# Patient Record
Sex: Female | Born: 1980 | Race: Black or African American | Hispanic: No | Marital: Single | Smoking: Never smoker
Health system: Southern US, Community
[De-identification: ages and names within clinical notes are randomized; demographics above are authoritative.]

## PROBLEM LIST (undated history)

## (undated) DIAGNOSIS — N879 Dysplasia of cervix uteri, unspecified: Secondary | ICD-10-CM

## (undated) DIAGNOSIS — N809 Endometriosis, unspecified: Secondary | ICD-10-CM

## (undated) DIAGNOSIS — N946 Dysmenorrhea, unspecified: Secondary | ICD-10-CM

## (undated) DIAGNOSIS — Z8742 Personal history of other diseases of the female genital tract: Secondary | ICD-10-CM

## (undated) DIAGNOSIS — E041 Nontoxic single thyroid nodule: Secondary | ICD-10-CM

## (undated) DIAGNOSIS — K219 Gastro-esophageal reflux disease without esophagitis: Secondary | ICD-10-CM

## (undated) DIAGNOSIS — L0390 Cellulitis, unspecified: Secondary | ICD-10-CM

## (undated) DIAGNOSIS — E010 Iodine-deficiency related diffuse (endemic) goiter: Secondary | ICD-10-CM

## (undated) DIAGNOSIS — E119 Type 2 diabetes mellitus without complications: Secondary | ICD-10-CM

## (undated) DIAGNOSIS — K449 Diaphragmatic hernia without obstruction or gangrene: Secondary | ICD-10-CM

## (undated) DIAGNOSIS — R809 Proteinuria, unspecified: Secondary | ICD-10-CM

## (undated) DIAGNOSIS — D134 Benign neoplasm of liver: Secondary | ICD-10-CM

## (undated) HISTORY — PX: LEEP: SHX91

## (undated) HISTORY — DX: Iodine-deficiency related diffuse (endemic) goiter: E01.0

## (undated) HISTORY — DX: Proteinuria, unspecified: R80.9

## (undated) HISTORY — DX: Personal history of other diseases of the female genital tract: Z87.42

## (undated) HISTORY — PX: LAPAROSCOPY: SHX197

## (undated) HISTORY — PX: COLPOSCOPY: SHX161

## (undated) HISTORY — DX: Diaphragmatic hernia without obstruction or gangrene: K44.9

## (undated) HISTORY — DX: Gastro-esophageal reflux disease without esophagitis: K21.9

## (undated) HISTORY — DX: Nontoxic single thyroid nodule: E04.1

## (undated) HISTORY — PX: REDUCTION MAMMAPLASTY: SUR839

## (undated) HISTORY — DX: Dysmenorrhea, unspecified: N94.6

## (undated) HISTORY — DX: Endometriosis, unspecified: N80.9

## (undated) HISTORY — DX: Benign neoplasm of liver: D13.4

## (undated) HISTORY — PX: OTHER SURGICAL HISTORY: SHX169

## (undated) HISTORY — PX: ESOPHAGOGASTRODUODENOSCOPY ENDOSCOPY: SHX5814

## (undated) HISTORY — DX: Type 2 diabetes mellitus without complications: E11.9

## (undated) HISTORY — DX: Dysplasia of cervix uteri, unspecified: N87.9

## (undated) HISTORY — DX: Cellulitis, unspecified: L03.90

## (undated) HISTORY — PX: BREAST BIOPSY: SHX20

## (undated) HISTORY — PX: RHINOPLASTY: SUR1284

---

## 2003-12-28 ENCOUNTER — Ambulatory Visit (HOSPITAL_COMMUNITY): Admission: RE | Admit: 2003-12-28 | Discharge: 2003-12-28 | Payer: Self-pay

## 2003-12-28 ENCOUNTER — Ambulatory Visit (HOSPITAL_BASED_OUTPATIENT_CLINIC_OR_DEPARTMENT_OTHER): Admission: RE | Admit: 2003-12-28 | Discharge: 2003-12-28 | Payer: Self-pay

## 2005-03-04 ENCOUNTER — Emergency Department (HOSPITAL_COMMUNITY): Admission: EM | Admit: 2005-03-04 | Discharge: 2005-03-04 | Payer: Self-pay | Admitting: Emergency Medicine

## 2005-03-12 ENCOUNTER — Emergency Department (HOSPITAL_COMMUNITY): Admission: EM | Admit: 2005-03-12 | Discharge: 2005-03-12 | Payer: Self-pay | Admitting: Emergency Medicine

## 2005-03-15 DIAGNOSIS — Z9889 Other specified postprocedural states: Secondary | ICD-10-CM | POA: Insufficient documentation

## 2005-03-15 HISTORY — DX: Other specified postprocedural states: Z98.890

## 2005-03-22 ENCOUNTER — Ambulatory Visit: Payer: Self-pay | Admitting: Unknown Physician Specialty

## 2005-09-06 ENCOUNTER — Ambulatory Visit: Payer: Self-pay

## 2006-03-05 ENCOUNTER — Emergency Department: Payer: Self-pay | Admitting: Emergency Medicine

## 2006-03-08 ENCOUNTER — Emergency Department: Payer: Self-pay | Admitting: General Practice

## 2006-04-27 ENCOUNTER — Emergency Department: Payer: Self-pay

## 2006-04-29 ENCOUNTER — Ambulatory Visit: Payer: Self-pay | Admitting: Emergency Medicine

## 2007-07-28 ENCOUNTER — Ambulatory Visit: Payer: Self-pay | Admitting: Unknown Physician Specialty

## 2007-08-05 ENCOUNTER — Ambulatory Visit: Payer: Self-pay | Admitting: Unknown Physician Specialty

## 2011-04-22 ENCOUNTER — Emergency Department: Payer: Self-pay | Admitting: Emergency Medicine

## 2011-04-22 LAB — CBC
HCT: 42 % (ref 35.0–47.0)
HGB: 13.8 g/dL (ref 12.0–16.0)
MCH: 27.8 pg (ref 26.0–34.0)
MCHC: 32.8 g/dL (ref 32.0–36.0)
MCV: 85 fL (ref 80–100)
RDW: 14.2 % (ref 11.5–14.5)

## 2011-04-22 LAB — TROPONIN I: Troponin-I: <0.02 ng/mL

## 2011-04-22 LAB — BASIC METABOLIC PANEL
Anion Gap: 10 (ref 7–16)
Calcium, Total: 9.2 mg/dL (ref 8.5–10.1)
Chloride: 104 mmol/L (ref 98–107)
Creatinine: 0.82 mg/dL (ref 0.60–1.30)
EGFR (African American): >60
Potassium: 3.7 mmol/L (ref 3.5–5.1)

## 2011-04-27 DIAGNOSIS — K2100 Gastro-esophageal reflux disease with esophagitis, without bleeding: Secondary | ICD-10-CM | POA: Insufficient documentation

## 2011-06-08 ENCOUNTER — Ambulatory Visit: Payer: Self-pay | Admitting: Unknown Physician Specialty

## 2011-06-29 DIAGNOSIS — K449 Diaphragmatic hernia without obstruction or gangrene: Secondary | ICD-10-CM | POA: Insufficient documentation

## 2011-06-29 DIAGNOSIS — E282 Polycystic ovarian syndrome: Secondary | ICD-10-CM | POA: Insufficient documentation

## 2011-12-28 DIAGNOSIS — E1121 Type 2 diabetes mellitus with diabetic nephropathy: Secondary | ICD-10-CM | POA: Insufficient documentation

## 2012-06-05 DIAGNOSIS — F32A Depression, unspecified: Secondary | ICD-10-CM | POA: Insufficient documentation

## 2013-05-29 ENCOUNTER — Ambulatory Visit: Payer: Self-pay

## 2014-02-23 DIAGNOSIS — N809 Endometriosis, unspecified: Secondary | ICD-10-CM | POA: Insufficient documentation

## 2014-05-03 ENCOUNTER — Ambulatory Visit: Admit: 2014-05-03 | Disposition: A | Discharge status: Home / Self Care | Payer: Self-pay

## 2014-05-19 ENCOUNTER — Other Ambulatory Visit: Payer: Self-pay

## 2014-05-19 ENCOUNTER — Emergency Department
Admission: EM | Admit: 2014-05-19 | Discharge: 2014-05-19 | Disposition: A | Discharge status: Home / Self Care | Payer: BLUE CROSS/BLUE SHIELD | Attending: Emergency Medicine | Admitting: Emergency Medicine

## 2014-05-19 DIAGNOSIS — R799 Abnormal finding of blood chemistry, unspecified: Secondary | ICD-10-CM | POA: Insufficient documentation

## 2014-05-19 DIAGNOSIS — R899 Unspecified abnormal finding in specimens from other organs, systems and tissues: Secondary | ICD-10-CM

## 2014-05-19 LAB — CBC
HEMATOCRIT: 41.2 % (ref 35.0–47.0)
Hemoglobin: 13.5 g/dL (ref 12.0–16.0)
MCH: 27.1 pg (ref 26.0–34.0)
MCHC: 32.8 g/dL (ref 32.0–36.0)
MCV: 82.6 fL (ref 80.0–100.0)
Platelets: 253 10*3/uL (ref 150–440)
RBC: 4.99 MIL/uL (ref 3.80–5.20)
RDW: 13.9 % (ref 11.5–14.5)
WBC: 9.3 10*3/uL (ref 3.6–11.0)

## 2014-05-19 LAB — BASIC METABOLIC PANEL
Anion gap: 7 (ref 5–15)
BUN: 11 mg/dL (ref 6–20)
CALCIUM: 9.3 mg/dL (ref 8.9–10.3)
CO2: 25 mmol/L (ref 22–32)
Chloride: 104 mmol/L (ref 101–111)
Creatinine, Ser: 0.6 mg/dL (ref 0.44–1.00)
GFR calc Af Amer: >60 mL/min (ref >60)
GFR calc non Af Amer: >60 mL/min (ref >60)
GLUCOSE: 166 mg/dL — AB (ref 65–99)
POTASSIUM: 3.4 mmol/L — AB (ref 3.5–5.1)
SODIUM: 136 mmol/L (ref 135–145)

## 2014-05-19 NOTE — ED Notes (Signed)
Pt ambulatory to room 14 without difficulty or distress noted; pt st sent over by her PCP (Dr Osborne Casco) for elevated K+ of 7.4; pt denies any c/o

## 2014-05-19 NOTE — ED Provider Notes (Signed)
[facility] Emergency Department Provider Note    ____________________________________________  Time seen: 2230  I have reviewed the triage vital signs and the nursing notes.   HISTORY  Chief Complaint No chief complaint on file.   History limited by: Not Limited   HPI Melissa Schroeder is a 34 y.o. female He was sent by her primary care doctor today because of concerns for elevated blood test. Patient states she had a blood draw earlier today as part of a routine checkup and prior to initiating medications. She states that roughly 2 hours after getting home she got a phone call stating her potassium was high and she needed to go to emergency department. She denies any chest pain, shortness breath, fevers. Denies ever having elevated potassium in the past.     No past medical history on file.  There are no active problems to display for this patient.   No past surgical history on file.  No current outpatient prescriptions on file.  Allergies Review of patient's allergies indicates not on file.  No family history on file.  Social History History  Substance Use Topics  . Smoking status: Not on file  . Smokeless tobacco: Not on file  . Alcohol Use: Not on file    Review of Systems  Constitutional: Negative for fever. Cardiovascular: Negative for chest pain. Respiratory: Negative for shortness of breath. Gastrointestinal: Negative for abdominal pain, vomiting and diarrhea. Genitourinary: Negative for dysuria. Musculoskeletal: Negative for back pain. Skin: Negative for rash. Neurological: had a mild headache this morning   10-point ROS otherwise negative.  ____________________________________________   PHYSICAL EXAM:   Constitutional: Alert and oriented. Well appearing and in no distress. Eyes: Conjunctivae are normal. PERRL. Normal extraocular movements. ENT   Head: Normocephalic and atraumatic.   Nose: No  congestion/rhinnorhea.   Mouth/Throat: Mucous membranes are moist.   Neck: No stridor. Hematological/Lymphatic/Immunilogical: No cervical lymphadenopathy. Cardiovascular: Normal rate, regular rhythm.  No murmurs, rubs, or gallops. Respiratory: Normal respiratory effort without tachypnea nor retractions. Breath sounds are clear and equal bilaterally. No wheezes/rales/rhonchi. Gastrointestinal: Soft and nontender. No distention.  Genitourinary: Deferred Musculoskeletal: Normal range of motion in all extremities. No joint effusions.  No lower extremity tenderness nor edema. Neurologic:  Normal speech and language. No gross focal neurologic deficits are appreciated. Speech is normal. No gait instability. Skin:  Skin is warm, dry and intact. No rash noted. Psychiatric: Mood and affect are normal. Speech and behavior are normal. Patient exhibits appropriate insight and judgment.  ____________________________________________    LABS (pertinent positives/negatives)  Labs Reviewed  BASIC METABOLIC PANEL - Abnormal; Notable for the following:    Potassium 3.4 (*)    Glucose, Bld 166 (*)    All other components within normal limits  CBC      ____________________________________________   EKG  EKG Time: 2227 Rate: 80 Rhythm: normal sinus rhythm Axis: normal Intervals: QTC 429 QRS: normal ST changes: no ST elevation, no peak T waves    ____________________________________________    RADIOLOGY  None  ____________________________________________   PROCEDURES  Procedure(s) performed: None  Critical Care performed: No  ____________________________________________   INITIAL IMPRESSION / ASSESSMENT AND PLAN / ED COURSE  Pertinent labs & imaging results that were available during my care of the patient were reviewed by me and considered in my medical decision making (see chart for details).  She was sent here by her primary care doctor because of concerns for  elevated potassium. EKG did not support this diagnosis.  Additionally lab work here does not show patient to be hyperkalemic.  In my judgment, in view of the above findings, the patient has a reassuring evaluation and can be safely discharged.Issues concerning treatment and diagnosis were discussed. There were no barriers to understanding. The plan of treatment explanation was well received by the patient and/or family who then verbalized understanding.  ____________________________________________   FINAL CLINICAL IMPRESSION(S) / ED DIAGNOSES  Final diagnoses:  Abnormal laboratory test result     Nance Pear, MD 05/19/14 2311

## 2014-05-19 NOTE — Discharge Instructions (Signed)
He seek medical attention for any high fevers, chest pain, shortness of breath, or any other new or concerning symptoms.

## 2014-05-19 NOTE — ED Notes (Signed)
   05/19/14 2215  Skin Color/Condition  Skin Color/Condition (WDL) WDL  Pt arrived to ED with reports of abnormal lab results. Pt has no c/o pain or discomfort. Pt reports blood drawn today at PMD office, staff called with reported  Potassium results of 7.4. Pt was told to report to ED.

## 2014-06-16 HISTORY — PX: BREAST SURGERY: SHX581

## 2015-05-20 IMAGING — US THYROID ULTRASOUND
1 series · 13 of 25 positions shown · non-contrast
Comparison: Thyroid ultrasound- 05/29/2013

CLINICAL DATA: History of thyroid nodule. History of prior biopsy
of left-sided thyroid nodule (7307).

EXAM:
THYROID ULTRASOUND
TECHNIQUE: Ultrasound examination of the thyroid gland and adjacent soft
tissues was performed.

[Series 1: thyroid ultrasound · 0.09mm/px · 13 of 102 slices shown]
[im 1/102]
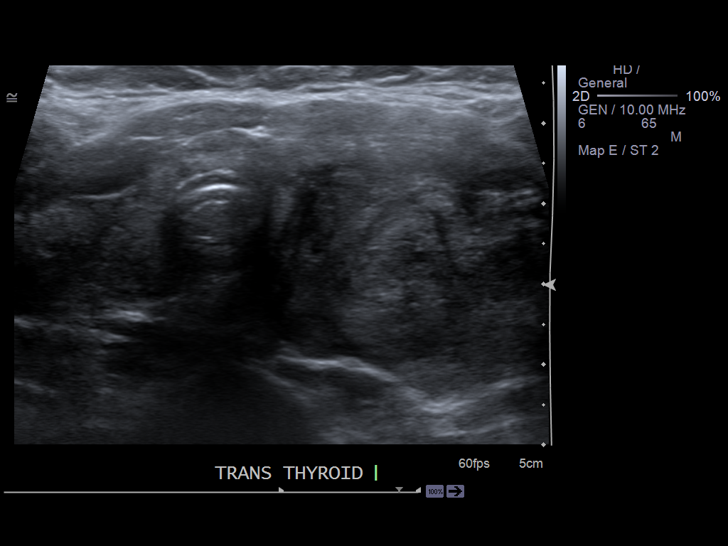
[im 9/102]
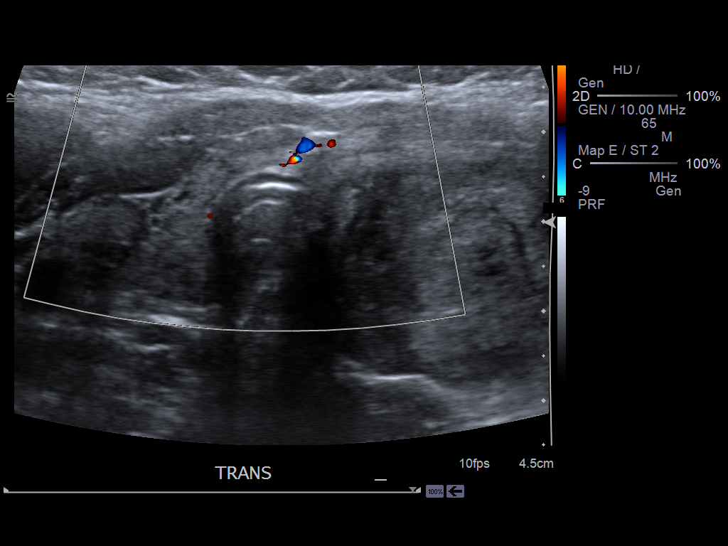
[im 17/102]
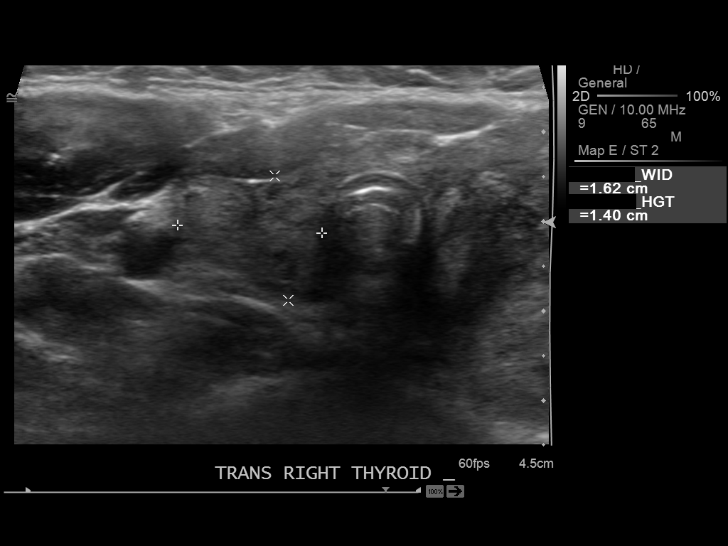
[im 26/102]
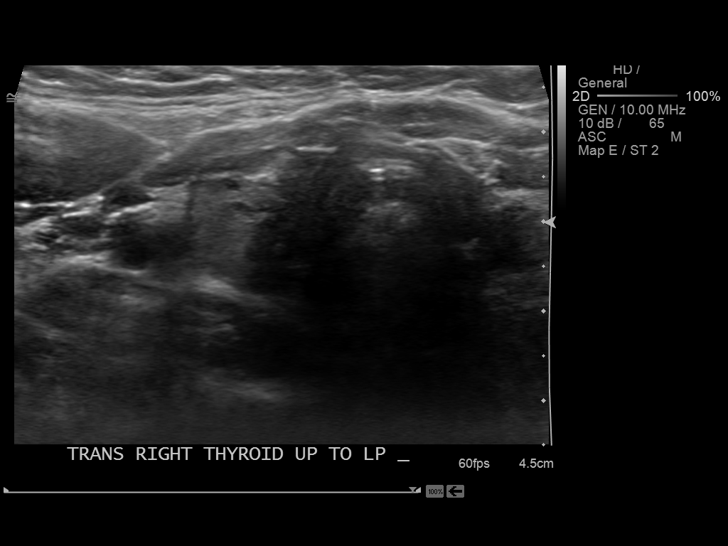
[im 34/102]
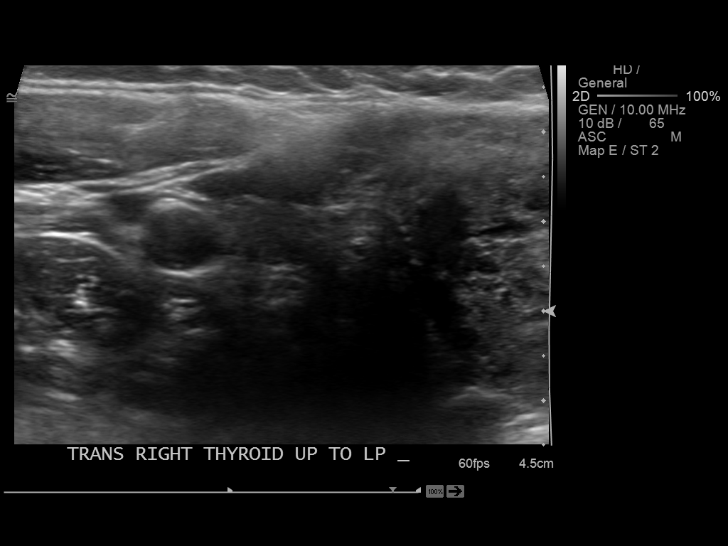
[im 43/102]
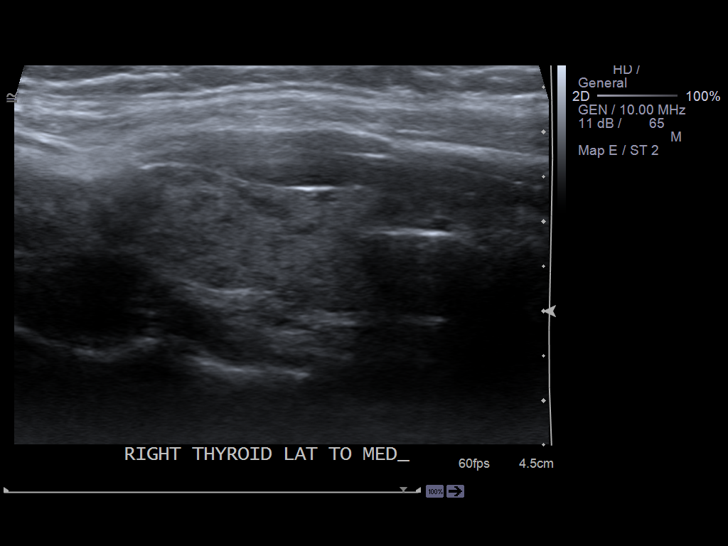
[im 51/102]
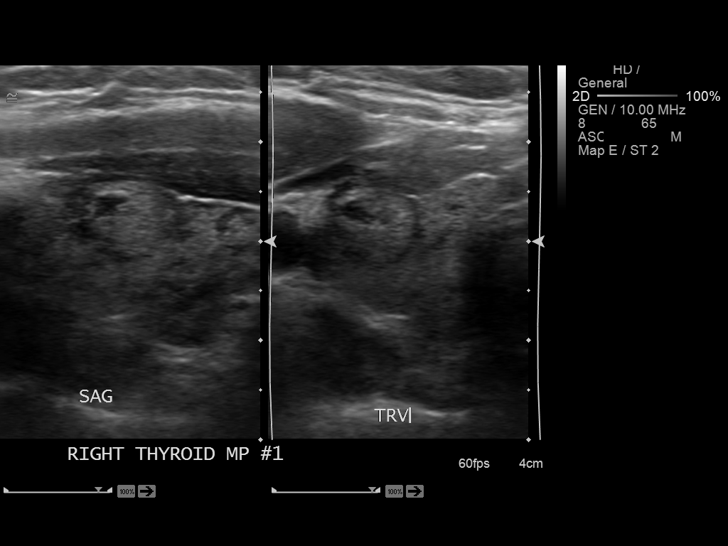
[im 59/102]
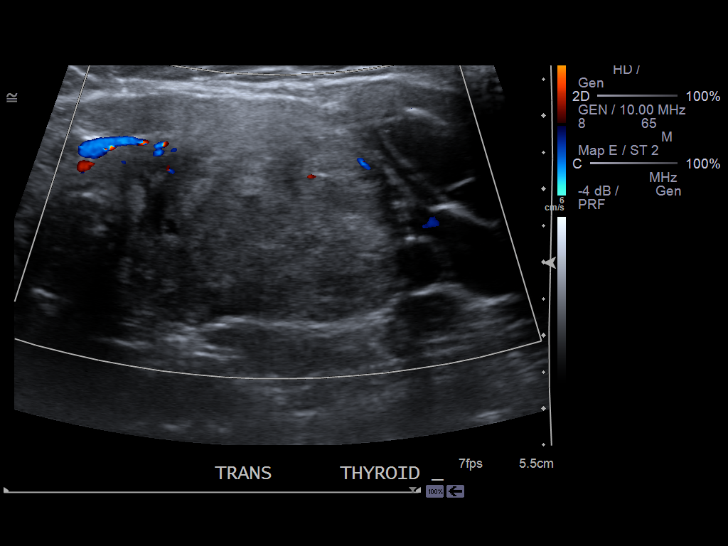
[im 68/102]
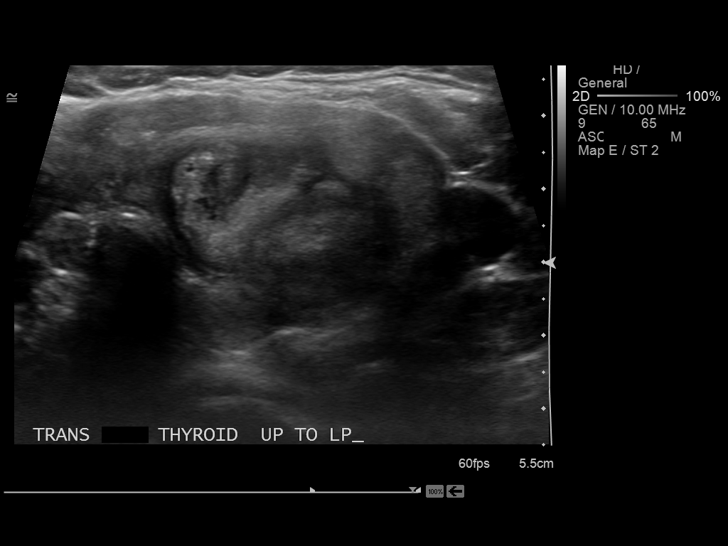
[im 76/102]
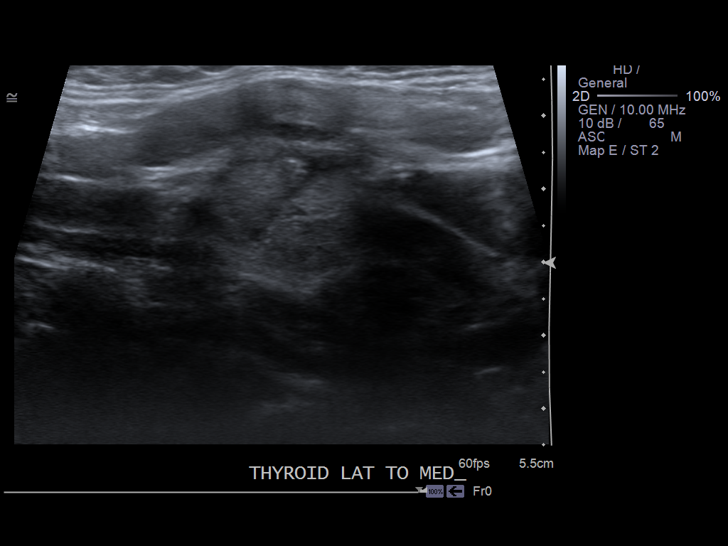
[im 85/102]
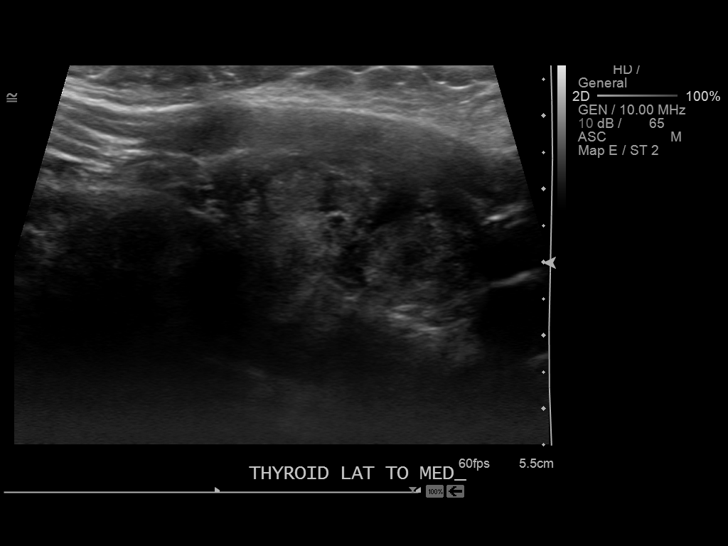
[im 93/102]
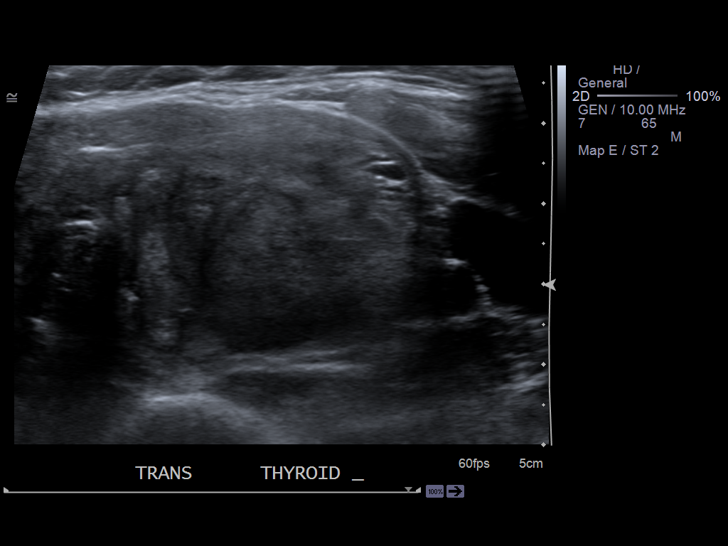
[im 102/102]
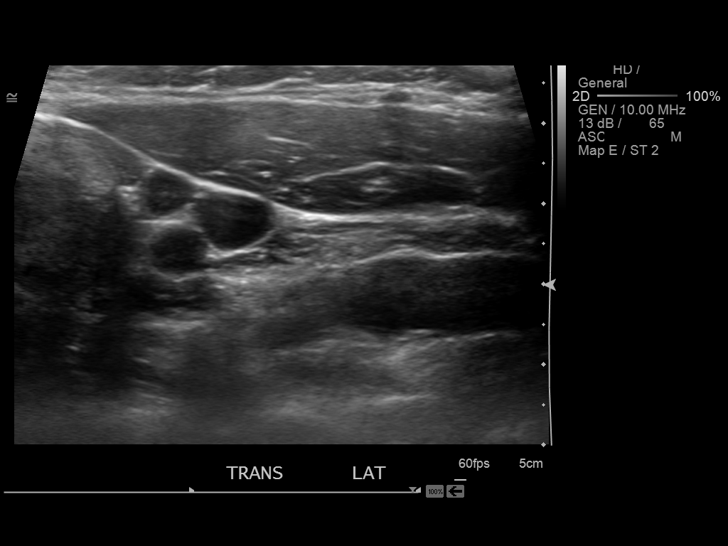

[13 of 25 positions shown; findings below may reference images not displayed]

FINDINGS: There is unchanged marked diffuse heterogeneity of thyroid
parenchymal echotexture. No definitive new or enlarging thyroid
nodules.

Right thyroid lobe

Measurements: Slightly diminutive in size measuring 4.3 x 1.4 x
cm.

Right, mid - 1.2 x 0.8 x 0.9 cm - mixed echogenic, partially cystic,
predominately solid, ill-defined, potentially a pseudo nodule -
grossly unchanged, previously, 0.9 x 0.5 x 1.0 cm

Left thyroid lobe

Measurements: Enlarged measuring 6.8 x 1.3 x 3.4 cm.

Left, mid - 3.4 x 2.9 x 3.1 cm - mixed echogenic, solid - unchanged
to minimally decreased in size in the interval, previously, 3.8 x
3.0 x 2.8 cm with slight differences likely attributable to scan
plane projection

Isthmus

Thickness: Normal in size measuring 0.5 cm in diameter.

No discrete nodule or mass identified within the thyroid isthmus

Lymphadenopathy

None visualized.
IMPRESSION: Similar findings of multi nodular goiter. No definitive new or
enlarging thyroid nodules. Correlation with biopsy results from
reported history of prior left-sided thyroid nodule biopsy is
recommended.

## 2016-02-24 HISTORY — PX: COLONOSCOPY: SHX5424

## 2016-05-30 DIAGNOSIS — I10 Essential (primary) hypertension: Secondary | ICD-10-CM | POA: Insufficient documentation

## 2016-10-19 ENCOUNTER — Telehealth: Payer: Self-pay

## 2016-10-19 NOTE — Telephone Encounter (Signed)
Pt's cycle is really, really, really heavy/clunky.  She has a lot going on - has gallstones and they want to take out her gallbladder and she has lesions on her liver, and they (providers) questions about her bc.  Is there anything you can rx to thin out her cycle.  [phone]  Pt states she is currently changing pads about q2h so it's not as bad as our protocol which she is aware of.  Also aware [facility] is out of office today.  Pt is at work at [employer] until [employer] and would like rx sent there this time.

## 2016-10-21 NOTE — Telephone Encounter (Signed)
Is she still doing continuous dosing of OCPs. Has she had her thyroid checked recently? If they don't want her on OCPs, we can do IUD. Pt due for appt 10/18 anyway.

## 2016-10-22 NOTE — Telephone Encounter (Signed)
Pt is not on any meds right now, they took her off bc d/t spots on her liver.  Her bleeding has slowed down now to just a tampon.  She knows she is due for annual but is waiting on other issues to resolve - gallbladder surg, spots on liver.  It doesn't should like she wants rx now.  Aware we can do IUD.  Adv to ask them which one they think she should have.

## 2016-10-22 NOTE — Telephone Encounter (Signed)
Ok. F/u prn.

## 2016-10-28 DIAGNOSIS — E669 Obesity, unspecified: Secondary | ICD-10-CM | POA: Insufficient documentation

## 2016-10-28 DIAGNOSIS — K8050 Calculus of bile duct without cholangitis or cholecystitis without obstruction: Secondary | ICD-10-CM | POA: Insufficient documentation

## 2016-11-15 DIAGNOSIS — L0390 Cellulitis, unspecified: Secondary | ICD-10-CM

## 2016-11-15 HISTORY — DX: Cellulitis, unspecified: L03.90

## 2016-11-27 NOTE — Telephone Encounter (Signed)
Pt wants to know can she try the nexplanon? And can she do annual and insertion on same day?

## 2016-11-27 NOTE — Telephone Encounter (Signed)
Pt has apt for AE 12/20/16 w/[facility]. She is inquiring if she needs to wait until then to get rx for a different birth control since she was taken off hers d/t hospitalization or if she needs an additional apt prior to AE to discuss birth control. HJ#[phone].

## 2016-11-28 NOTE — Telephone Encounter (Signed)
Duplicate msg. Done on alternate msg

## 2016-11-28 NOTE — Telephone Encounter (Signed)
LM for pt that we can do nexplanon and annual the same day. Hx of gallstones and liver lesions. Off oral estrogen containing OCPs.

## 2016-12-17 ENCOUNTER — Ambulatory Visit (INDEPENDENT_AMBULATORY_CARE_PROVIDER_SITE_OTHER): Payer: BC Managed Care – PPO | Admitting: Obstetrics and Gynecology

## 2016-12-17 ENCOUNTER — Encounter: Payer: Self-pay | Admitting: Obstetrics and Gynecology

## 2016-12-17 VITALS — BP 110/60 | HR 80 | Ht 63.0 in | Wt 192.0 lb

## 2016-12-17 DIAGNOSIS — Z01419 Encounter for gynecological examination (general) (routine) without abnormal findings: Secondary | ICD-10-CM

## 2016-12-17 DIAGNOSIS — N809 Endometriosis, unspecified: Secondary | ICD-10-CM

## 2016-12-17 DIAGNOSIS — E010 Iodine-deficiency related diffuse (endemic) goiter: Secondary | ICD-10-CM | POA: Diagnosis not present

## 2016-12-17 DIAGNOSIS — Z30013 Encounter for initial prescription of injectable contraceptive: Secondary | ICD-10-CM

## 2016-12-17 MED ORDER — MEDROXYPROGESTERONE ACETATE 150 MG/ML IM SUSY: 150.0000 mg | PREFILLED_SYRINGE | Freq: Once | INTRAMUSCULAR | 3 refills | Status: DC

## 2016-12-17 NOTE — Patient Instructions (Signed)
I value your feedback and entrusting us with your care. If you get a [place] patient survey, I would appreciate you taking the time to let us know about your experience today. Thank you! 

## 2016-12-17 NOTE — Progress Notes (Signed)
PCP:  Juliette Mangle, PA-C (Inactive)   Chief Complaint  Patient presents with  . Gynecologic Exam     HPI:      Melissa Schroeder is a 36 y.o. No obstetric history on file. who LMP was Patient's last menstrual period was 11/25/2016 (exact date)., presents today for her annual examination.  Her menses are regular every 28-30 days, lasting 7 days.  Dysmenorrhea mild, occurring first 1-2 days of flow. She does not have intermenstrual bleeding. She was on cont dosing of OCPs for endometriosis but was recently diagnosed with gallstones and liver lesions, and was taken off OCPs by GI 2 months ago. Pt not having significant endometriosis sx.  She is allowed to have prog only BC per GI. Pt did depo in high school without any problems. Doesn't want nexplanon. Had 2 Mirena in the past but they both "fell out".   Sex activity: single partner, contraception - none.  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 There is no FH of ovarian cancer. The patient does do self-breast exams.  Tobacco use: The patient denies current or previous tobacco use. Alcohol use: none No drug use.  Exercise: not active  She does get adequate calcium and Vitamin D in her diet.  Labs with PCP.  She currently has wound abscess and cellulitis on her abd and is on abx. She also has a yeast infection and was started on diflucan yesterday.   She has a hx of thyromegaly with nodules in 2016 and saw Dr. Ronnald Collum. She had neg labs, bx and repeat u/s 2016. She is due back to see him this yr. Pt states recent TSH labs with PCP were normal, but has not had f/u u/s.   Past Medical History:  Diagnosis Date  . Cellulitis 11/2016   lower abdomen  . Cervical dysplasia    CIN II  . Diabetes mellitus without complication ([place])   . Dysmenorrhea   . Endometriosis   . GERD  (gastroesophageal reflux disease)   . Hiatal hernia   . History of abnormal cervical Pap smear   . Proteinuria   . Thyroid nodule   . Thyromegaly     Past Surgical History:  Procedure Laterality Date  . BREAST SURGERY  06/2014   scar revision of left side  . COLPOSCOPY    . ESOPHAGOGASTRODUODENOSCOPY ENDOSCOPY    . LAPAROSCOPY    . LEEP    . reduction mammoplasty Bilateral   . RHINOPLASTY     deviated septum  . sweat glands removed under left arm      Family History  Problem Relation Age of Onset  . Hypertension Mother   . Prostate cancer Paternal Uncle   . Brain cancer Paternal Grandfather 74  . Breast cancer Other 50  . Colon cancer Other     Social History   Socioeconomic History  . Marital status: Single    Spouse name: Not on file  . Number of children: Not on file  . Years of education: Not on file  . Highest education level: Not on file  Social Needs  . Financial resource strain: Not on file  . Food insecurity - worry: Not on file  . Food insecurity - inability: Not on file  . Transportation needs - medical: Not on file  . Transportation  needs - non-medical: Not on file  Occupational History  . Not on file  Tobacco Use  . Smoking status: Never Smoker  . Smokeless tobacco: Never Used  Substance and Sexual Activity  . Alcohol use: No    Frequency: Never  . Drug use: No  . Sexual activity: Yes    Birth control/protection: None  Other Topics Concern  . Not on file  Social History Narrative  . Not on file    Current Meds  Medication Sig  . acetaminophen (TYLENOL) 325 MG tablet Take 650 mg by mouth.  Marland Kitchen amoxicillin-clavulanate (AUGMENTIN) 875-125 MG tablet Take by mouth.  . cetirizine (ZYRTEC) 10 MG tablet TAKE 1 TABLET BY MOUTH EVERY DAY (NOT COVER)  . doxycycline (VIBRAMYCIN) 100 MG capsule Take 100 mg by mouth.  . enalapril (VASOTEC) 10 MG tablet Take 10 mg by mouth daily.  Marland Kitchen escitalopram (LEXAPRO) 20 MG tablet TAKE 1 TABLET (20 MG TOTAL) BY  MOUTH DAILY.  . fluconazole (DIFLUCAN) 150 MG tablet Take 150 mg by mouth.  . fluticasone (FLONASE) 50 MCG/ACT nasal spray 1 spray by Each Nare route daily.  Marland Kitchen glipiZIDE (GLUCOTROL XL) 10 MG 24 hr tablet Take 10 mg by mouth.  Marland Kitchen JARDIANCE 10 MG TABS tablet Take 10 mg by mouth daily.  Marland Kitchen omeprazole (PRILOSEC) 20 MG capsule TAKE 1 CAPSULE (20 MG TOTAL) BY MOUTH DAILY.  . ONE TOUCH ULTRA TEST test strip TEST BLOOD SUGARS TID. PLEASE DISPENSE PER INSURANCE COVERAGE. ICD-10 E11.9  . ONETOUCH DELICA LANCETS 35T MISC 1 EACH BY OTHER ROUTE THREE (3) TIMES A DAY.  Marland Kitchen oxyCODONE (OXY IR/ROXICODONE) 5 MG immediate release tablet TAKE 1 TABLET BY MOUTH EVERY 4 HOURS AS NEEDED FOR UP TO 5 DAYS  . traZODone (DESYREL) 100 MG tablet Take 50 mg by mouth.  . [DISCONTINUED] ONETOUCH DELICA LANCETS 73U MISC 1 each by Other route Three (3) times a day.     ROS:  Review of Systems  Constitutional: Negative for fatigue, fever and unexpected weight change.  Respiratory: Negative for cough, shortness of breath and wheezing.   Cardiovascular: Negative for chest pain, palpitations and leg swelling.  Gastrointestinal: Negative for blood in stool, constipation, diarrhea, nausea and vomiting.  Endocrine: Negative for cold intolerance, heat intolerance and polyuria.  Genitourinary: Positive for vaginal discharge. Negative for dyspareunia, dysuria, flank pain, frequency, genital sores, hematuria, menstrual problem, pelvic pain, urgency, vaginal bleeding and vaginal pain.  Musculoskeletal: Negative for back pain, joint swelling and myalgias.  Skin: Negative for rash.  Neurological: Negative for dizziness, syncope, light-headedness, numbness and headaches.  Hematological: Negative for adenopathy.  Psychiatric/Behavioral: Negative for agitation, confusion, sleep disturbance and suicidal ideas. The patient is not nervous/anxious.      Objective: BP 110/60   Pulse 80   Ht 5\' 3"  (1.6 m)   Wt 192 lb (87.1 kg)   LMP  11/25/2016 (Exact Date)   BMI 34.01 kg/m    Physical Exam  Constitutional: She is oriented to person, place, and time. She appears well-developed and well-nourished.  Genitourinary: Vagina normal and uterus normal. There is no rash or tenderness on the right labia. There is no rash or tenderness on the left labia. No erythema or tenderness in the vagina. No vaginal discharge found. Right adnexum does not display mass and does not display tenderness. Left adnexum does not display mass and does not display tenderness. Cervix does not exhibit motion tenderness or polyp. Uterus is not enlarged or tender.  Neck: Normal range of motion.  Thyromegaly present.  LT LOBE LARGER THAN RT LOBE AND LARGER THAN IN PAST  Cardiovascular: Normal rate, regular rhythm and normal heart sounds.  No murmur heard. Pulmonary/Chest: Effort normal and breath sounds normal. Right breast exhibits no mass, no nipple discharge, no skin change and no tenderness. Left breast exhibits no mass, no nipple discharge, no skin change and no tenderness.  Abdominal: Soft. There is no tenderness. There is no guarding.  Musculoskeletal: Normal range of motion.  Neurological: She is alert and oriented to person, place, and time. No cranial nerve deficit.  Psychiatric: She has a normal mood and affect. Her behavior is normal.  Vitals reviewed.   Assessment/Plan: Encounter for annual routine gynecological examination  Encounter for initial prescription of injectable contraceptive - Prog only methods discussed. Pt wants depo. RTO with menses for inj. Rx depo eRxd. Condoms in meantime. F/u if changes her mind. Cont calcium intake. - Plan: MedroxyPROGESTERone Acetate 150 MG/ML SUSY  Endometriosis - Stable sx currently. Start depo with next menses.  Thyromegaly - LT >RT. WNL labs with PCP per pt report. Check u/s and will refer back to Dr. Ronnald Collum prn. - Plan: US THYROID  Meds ordered this encounter  Medications  . MedroxyPROGESTERone  Acetate 150 MG/ML SUSY    Sig: Inject 1 mL (150 mg total) into the muscle once for 1 dose.    Dispense:  1 Syringe    Refill:  3             GYN counsel adequate intake of calcium and vitamin D, diet and exercise     F/U  Return in about 1 year (around 12/17/2017).  Melissa B. Copland, PA-C 12/17/2016 3:05 PM

## 2016-12-18 DIAGNOSIS — L02219 Cutaneous abscess of trunk, unspecified: Secondary | ICD-10-CM | POA: Insufficient documentation

## 2016-12-20 ENCOUNTER — Ambulatory Visit: Payer: Self-pay | Admitting: Obstetrics and Gynecology

## 2016-12-25 ENCOUNTER — Ambulatory Visit
Admission: RE | Admit: 2016-12-25 | Discharge: 2016-12-25 | Disposition: A | Discharge status: Home / Self Care | Payer: BC Managed Care – PPO | Source: Ambulatory Visit | Attending: Obstetrics and Gynecology | Admitting: Obstetrics and Gynecology

## 2016-12-25 ENCOUNTER — Ambulatory Visit: Payer: BC Managed Care – PPO

## 2016-12-25 DIAGNOSIS — E042 Nontoxic multinodular goiter: Secondary | ICD-10-CM | POA: Insufficient documentation

## 2016-12-25 DIAGNOSIS — E010 Iodine-deficiency related diffuse (endemic) goiter: Secondary | ICD-10-CM | POA: Diagnosis present

## 2016-12-27 ENCOUNTER — Telehealth: Payer: Self-pay | Admitting: Obstetrics and Gynecology

## 2016-12-27 ENCOUNTER — Ambulatory Visit (INDEPENDENT_AMBULATORY_CARE_PROVIDER_SITE_OTHER): Payer: BC Managed Care – PPO

## 2016-12-27 DIAGNOSIS — E041 Nontoxic single thyroid nodule: Secondary | ICD-10-CM

## 2016-12-27 DIAGNOSIS — E049 Nontoxic goiter, unspecified: Secondary | ICD-10-CM

## 2016-12-27 DIAGNOSIS — Z3042 Encounter for surveillance of injectable contraceptive: Secondary | ICD-10-CM

## 2016-12-27 MED ORDER — MEDROXYPROGESTERONE ACETATE 150 MG/ML IM SUSP
150.0000 mg | Freq: Once | INTRAMUSCULAR | Status: AC
  Administered 2016-12-27: 150 mg via INTRAMUSCULAR

## 2016-12-27 NOTE — Telephone Encounter (Signed)
Pt aware of thyroid u/s results. Pt had neg thyroid bx LT lobe in past at [facility]. Refer back to [facility] for their final opinion re: size of LT thyroid nodule and further eval/mgmt. Pt will MyChart msg me name of endocrine she wants to see since her previous endocrine MD has left. At [facility].

## 2016-12-28 NOTE — Addendum Note (Signed)
Addended by: Ardeth Perfect B on: 29/29/0903 09:56 AM   Modules accepted: Orders

## 2017-01-14 ENCOUNTER — Telehealth: Payer: Self-pay

## 2017-01-14 ENCOUNTER — Other Ambulatory Visit: Payer: Self-pay | Admitting: Obstetrics and Gynecology

## 2017-01-14 MED ORDER — TERCONAZOLE 0.4 % VA CREA: 1.0000 | TOPICAL_CREAM | Freq: Every day | VAGINAL | 0 refills | Status: DC

## 2017-01-14 NOTE — Telephone Encounter (Signed)
Rx terazol 7 eRxd since pt treated with diflucan and OTC meds without relief. F/u if sx persist. RN to notify pt.

## 2017-01-14 NOTE — Telephone Encounter (Signed)
Pt was seen on 12/17/16 and had vaginal irritation and has tried OTC medication that has not been working. Pt requests rx to be sent in. Cb# [phone] thank you.

## 2017-01-14 NOTE — Telephone Encounter (Signed)
Called pt and she answered but phone disconnected. Tried to call back and went to voicemail.

## 2017-01-14 NOTE — Progress Notes (Signed)
Rx terazol for yeast vag not resolved with diflucan and OTC meds. F/u prn.

## 2017-01-16 NOTE — Telephone Encounter (Signed)
Pt aware and picked up rx

## 2017-02-11 DIAGNOSIS — E042 Nontoxic multinodular goiter: Secondary | ICD-10-CM | POA: Insufficient documentation

## 2017-03-21 ENCOUNTER — Ambulatory Visit: Payer: BC Managed Care – PPO

## 2017-03-26 ENCOUNTER — Ambulatory Visit (INDEPENDENT_AMBULATORY_CARE_PROVIDER_SITE_OTHER): Payer: BC Managed Care – PPO

## 2017-03-26 DIAGNOSIS — Z3042 Encounter for surveillance of injectable contraceptive: Secondary | ICD-10-CM | POA: Diagnosis not present

## 2017-03-26 MED ORDER — MEDROXYPROGESTERONE ACETATE 150 MG/ML IM SUSP
150.0000 mg | Freq: Once | INTRAMUSCULAR | Status: AC
  Administered 2017-03-26: 150 mg via INTRAMUSCULAR

## 2017-06-17 ENCOUNTER — Ambulatory Visit: Payer: BC Managed Care – PPO

## 2017-06-20 ENCOUNTER — Ambulatory Visit (INDEPENDENT_AMBULATORY_CARE_PROVIDER_SITE_OTHER): Payer: BC Managed Care – PPO

## 2017-06-20 DIAGNOSIS — Z308 Encounter for other contraceptive management: Secondary | ICD-10-CM

## 2017-06-20 MED ORDER — MEDROXYPROGESTERONE ACETATE 150 MG/ML IM SUSP
150.0000 mg | Freq: Once | INTRAMUSCULAR | Status: AC
  Administered 2017-06-20: 150 mg via INTRAMUSCULAR

## 2017-06-20 NOTE — Progress Notes (Signed)
Pt here for depo which was given IM right glut.  [place] # [phone]  Pt thinking about letting this be her last inj as she is thinking about having a baby.

## 2017-09-12 ENCOUNTER — Ambulatory Visit: Payer: BC Managed Care – PPO

## 2017-09-26 ENCOUNTER — Ambulatory Visit (INDEPENDENT_AMBULATORY_CARE_PROVIDER_SITE_OTHER): Payer: BC Managed Care – PPO

## 2017-09-26 ENCOUNTER — Telehealth: Payer: Self-pay | Admitting: Obstetrics and Gynecology

## 2017-09-26 ENCOUNTER — Other Ambulatory Visit: Payer: Self-pay

## 2017-09-26 DIAGNOSIS — Z3042 Encounter for surveillance of injectable contraceptive: Secondary | ICD-10-CM

## 2017-09-26 DIAGNOSIS — Z308 Encounter for other contraceptive management: Secondary | ICD-10-CM

## 2017-09-26 DIAGNOSIS — Z3202 Encounter for pregnancy test, result negative: Secondary | ICD-10-CM | POA: Diagnosis not present

## 2017-09-26 DIAGNOSIS — Z30013 Encounter for initial prescription of injectable contraceptive: Secondary | ICD-10-CM

## 2017-09-26 LAB — POCT URINE PREGNANCY: Preg Test, Ur: NEGATIVE

## 2017-09-26 MED ORDER — MEDROXYPROGESTERONE ACETATE 150 MG/ML IM SUSY: 150.0000 mg | PREFILLED_SYRINGE | Freq: Once | INTRAMUSCULAR | 0 refills | Status: DC

## 2017-09-26 MED ORDER — MEDROXYPROGESTERONE ACETATE 150 MG/ML IM SUSP
150.0000 mg | Freq: Once | INTRAMUSCULAR | Status: AC
  Administered 2017-09-26: 150 mg via INTRAMUSCULAR

## 2017-09-26 NOTE — Addendum Note (Signed)
Addended by: Cleophas Dunker D on: 09/26/2017 11:39 AM   Modules accepted: Orders

## 2017-09-26 NOTE — Telephone Encounter (Signed)
Patient will need depo refill for next appt, annual scheduled with [facility] 12/5.   [facility] [address] [address].

## 2017-09-26 NOTE — Progress Notes (Signed)
Pt here for depo which was given IM right glut after obtaining negative urine preg test.  NDC# [phone]

## 2017-12-18 ENCOUNTER — Ambulatory Visit (INDEPENDENT_AMBULATORY_CARE_PROVIDER_SITE_OTHER): Payer: BC Managed Care – PPO | Admitting: Obstetrics and Gynecology

## 2017-12-18 ENCOUNTER — Encounter: Payer: Self-pay | Admitting: Obstetrics and Gynecology

## 2017-12-18 VITALS — BP 140/80 | HR 80 | Ht 63.0 in | Wt 242.0 lb

## 2017-12-18 DIAGNOSIS — E049 Nontoxic goiter, unspecified: Secondary | ICD-10-CM

## 2017-12-18 DIAGNOSIS — Z01419 Encounter for gynecological examination (general) (routine) without abnormal findings: Secondary | ICD-10-CM

## 2017-12-18 DIAGNOSIS — Z3169 Encounter for other general counseling and advice on procreation: Secondary | ICD-10-CM

## 2017-12-18 DIAGNOSIS — N3941 Urge incontinence: Secondary | ICD-10-CM

## 2017-12-18 NOTE — Progress Notes (Signed)
PCP:  Erline Levine, MD   Chief Complaint  Patient presents with  . Gynecologic Exam    pt is not getting depo today, she states she is stopping it     HPI:      [name] is a 37 y.o. No obstetric history on file. who LMP was No LMP recorded (lmp unknown)., presents today for her annual examination.  Her menses are absent due to depo. Pt would like to conceive so doesn't want to continue injections. Last depo 9/19.   She was on cont dosing of OCPs for endometriosis in past but was  diagnosed with gallstones and liver lesions last yr and can no longer have estrogen BC. Had 2 Mirenas in the past but they both "fell out".   Sex activity: single partner, contraception - none. Wants to conceive. Not taking PNVs.  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 There is no FH of ovarian cancer. The patient does not do self-breast exams.  Tobacco use: The patient denies current or previous tobacco use. Alcohol use: social No drug use.  Exercise: not active  She does get adequate calcium and Vitamin D in her diet.  Labs with PCP.  She has a hx of thyromegaly with nodules. She has had neg labs, bx and repeat u/s. Followed by endocrine at [facility]. Also sees endocrine for DM and they are fine with her trying to conceive. Will change meds to insulin during pregnancy.   Pt has noticed urge incont recently. Drinks caffeine daily, some water. Also has occas SUI.   Past Medical History:  Diagnosis Date  . Cellulitis 11/2016   lower abdomen  . Cervical dysplasia    CIN II  . Diabetes mellitus without complication ([place])   . Dysmenorrhea   . Endometriosis   . GERD (gastroesophageal reflux disease)   . Hiatal hernia   . History of abnormal cervical Pap smear   . Proteinuria   . Thyroid nodule   . Thyromegaly     Past Surgical  History:  Procedure Laterality Date  . BREAST SURGERY  06/2014   scar revision of left side  . COLPOSCOPY    . ESOPHAGOGASTRODUODENOSCOPY ENDOSCOPY    . LAPAROSCOPY    . LEEP    . reduction mammoplasty Bilateral   . RHINOPLASTY     deviated septum  . sweat glands removed under left arm      Family History  Problem Relation Age of Onset  . Hypertension Mother   . Prostate cancer Paternal Uncle   . Brain cancer Paternal Grandfather 3  . Breast cancer Other 50  . Colon cancer Other     Social History   Socioeconomic History  . Marital status: Single    Spouse name: Not on file  . Number of children: Not on file  . Years of education: Not on file  . Highest education level: Not on file  Occupational History  . Not on file  Social Needs  . Financial resource strain: Not on file  . Food insecurity:    Worry: Not on file    Inability: Not on file  . Transportation needs:    Medical: Not on file    Non-medical: Not on file  Tobacco Use  . Smoking status: Never Smoker  . Smokeless tobacco: Never  Used  Substance and Sexual Activity  . Alcohol use: No    Frequency: Never  . Drug use: No  . Sexual activity: Yes    Birth control/protection: None  Lifestyle  . Physical activity:    Days per week: 0 days    Minutes per session: 0 min  . Stress: Only a little  Relationships  . Social connections:    Talks on phone: More than three times a week    Gets together: Once a week    Attends religious service: More than 4 times per year    Active member of club or organization: No    Attends meetings of clubs or organizations: Never    Relationship status: Never married  . Intimate partner violence:    Fear of current or ex partner: No    Emotionally abused: No    Physically abused: No    Forced sexual activity: No  Other Topics Concern  . Not on file  Social History Narrative  . Not on file    Current Meds  Medication Sig  . cetirizine (ZYRTEC) 10 MG tablet TAKE  1 TABLET BY MOUTH EVERY DAY (NOT COVER)  . clindamycin (CLEOCIN T) 1 % lotion Apply topically.  Marland Kitchen escitalopram (LEXAPRO) 20 MG tablet TAKE 1 TABLET (20 MG TOTAL) BY MOUTH DAILY.  Marland Kitchen glipiZIDE (GLUCOTROL XL) 10 MG 24 hr tablet Take 10 mg by mouth.  Marland Kitchen omeprazole (PRILOSEC) 20 MG capsule TAKE 1 CAPSULE (20 MG TOTAL) BY MOUTH DAILY.  . ONE TOUCH ULTRA TEST test strip TEST BLOOD SUGARS TID. PLEASE DISPENSE PER INSURANCE COVERAGE. ICD-10 E11.9  . ONETOUCH DELICA LANCETS 51W MISC 1 EACH BY OTHER ROUTE THREE (3) TIMES A DAY.  . pioglitazone (ACTOS) 30 MG tablet Take by mouth.  . traZODone (DESYREL) 50 MG tablet Take by mouth.     ROS:  Review of Systems  Constitutional: Negative for fatigue, fever and unexpected weight change.  Respiratory: Negative for cough, shortness of breath and wheezing.   Cardiovascular: Negative for chest pain, palpitations and leg swelling.  Gastrointestinal: Negative for blood in stool, constipation, diarrhea, nausea and vomiting.  Endocrine: Negative for cold intolerance, heat intolerance and polyuria.  Genitourinary: Negative for dyspareunia, dysuria, flank pain, frequency, genital sores, hematuria, menstrual problem, pelvic pain, urgency, vaginal bleeding, vaginal discharge and vaginal pain.  Musculoskeletal: Negative for back pain, joint swelling and myalgias.  Skin: Negative for rash.  Neurological: Negative for dizziness, syncope, light-headedness, numbness and headaches.  Hematological: Negative for adenopathy.  Psychiatric/Behavioral: Negative for agitation, confusion, sleep disturbance and suicidal ideas. The patient is not nervous/anxious.      Objective: BP 140/80   Pulse 80   Ht 5\' 3"  (1.6 m)   Wt 242 lb (109.8 kg)   LMP  (LMP Unknown)   BMI 42.87 kg/m    Physical Exam  Constitutional: She is oriented to person, place, and time. She appears well-developed and well-nourished.  Genitourinary: Vagina normal and uterus normal. There is no rash or  tenderness on the right labia. There is no rash or tenderness on the left labia. No erythema or tenderness in the vagina. No vaginal discharge found. Right adnexum does not display mass and does not display tenderness. Left adnexum does not display mass and does not display tenderness. Cervix does not exhibit motion tenderness or polyp. Uterus is not enlarged or tender.  Neck: Normal range of motion. Thyromegaly present.  LT LOBE LARGER THAN RT LOBE AND LARGER THAN IN PAST  Cardiovascular: Normal rate, regular rhythm and normal heart sounds.  No murmur heard. Pulmonary/Chest: Effort normal and breath sounds normal. Right breast exhibits no mass, no nipple discharge, no skin change and no tenderness. Left breast exhibits no mass, no nipple discharge, no skin change and no tenderness.  Abdominal: Soft. There is no tenderness. There is no guarding.  Musculoskeletal: Normal range of motion.  Neurological: She is alert and oriented to person, place, and time. No cranial nerve deficit.  Psychiatric: She has a normal mood and affect. Her behavior is normal.  Vitals reviewed.   Assessment/Plan: Encounter for annual routine gynecological examination  Pre-conception counseling - Start PNVs. May take 2-3 months up to 18 months before she starts cycling again normally after stopping depo this month. F/u prn.   Enlarged thyroid - Followed [facility]  Urge incontinence - D/C caffeine/void frequently.       GYN counsel adequate intake of calcium and vitamin D, diet and exercise     F/U  Return in about 1 year (around 12/19/2018).  Arren Laminack B. Nalu Troublefield, PA-C 12/18/2017 5:09 PM

## 2017-12-18 NOTE — Patient Instructions (Signed)
I value your feedback and entrusting us with your care. If you get a [facility] patient survey, I would appreciate you taking the time to let us know about your experience today. Thank you! 

## 2017-12-19 ENCOUNTER — Ambulatory Visit: Payer: BC Managed Care – PPO | Admitting: Obstetrics and Gynecology

## 2018-01-29 ENCOUNTER — Encounter: Payer: Self-pay | Admitting: Obstetrics and Gynecology

## 2019-01-23 DIAGNOSIS — D500 Iron deficiency anemia secondary to blood loss (chronic): Secondary | ICD-10-CM | POA: Insufficient documentation

## 2019-02-25 ENCOUNTER — Other Ambulatory Visit: Payer: Self-pay

## 2019-02-25 ENCOUNTER — Ambulatory Visit (INDEPENDENT_AMBULATORY_CARE_PROVIDER_SITE_OTHER): Payer: BC Managed Care – PPO | Admitting: Obstetrics and Gynecology

## 2019-02-25 ENCOUNTER — Encounter: Payer: Self-pay | Admitting: Obstetrics and Gynecology

## 2019-02-25 VITALS — BP 120/90 | Ht 63.0 in | Wt 247.0 lb

## 2019-02-25 DIAGNOSIS — B373 Candidiasis of vulva and vagina: Secondary | ICD-10-CM

## 2019-02-25 DIAGNOSIS — B3731 Acute candidiasis of vulva and vagina: Secondary | ICD-10-CM

## 2019-02-25 LAB — POCT WET PREP WITH KOH
Clue Cells Wet Prep HPF POC: NEGATIVE
KOH Prep POC: NEGATIVE
Trichomonas, UA: NEGATIVE
Yeast Wet Prep HPF POC: POSITIVE

## 2019-02-25 MED ORDER — FLUCONAZOLE 150 MG PO TABS: 150.0000 mg | ORAL_TABLET | Freq: Once | ORAL | 0 refills | Status: DC

## 2019-02-25 NOTE — Patient Instructions (Signed)
I value your feedback and entrusting us with your care. If you get a [facility] patient survey, I would appreciate you taking the time to let us know about your experience today. Thank you!  As of December 25, 2018, your lab results will be released to your MyChart immediately, before I even have a chance to see them. Please give me time to review them and contact you if there are any abnormalities. Thank you for your patience.  

## 2019-02-25 NOTE — Progress Notes (Signed)
Kimel-Scott, Santiago Glad, MD   Chief Complaint  Patient presents with  . Vaginal Discharge    sour odor, itching, irritation since Sat    HPI:      [name] is a 39 y.o. G3P0030 who LMP was Patient's last menstrual period was 02/01/2019 (approximate)., presents today for increased d/c with irritation/itching, sour odor, for 5 days. Started monistat-7 x 1 dose last night with some sx improvement today. Accidentally used scented soap before sx. No prior abx use. Hx of DM with controlled blood sugars.  No urin sx, no pelvic pain.    Past Medical History:  Diagnosis Date  . Cellulitis 11/2016   lower abdomen  . Cervical dysplasia    CIN II  . Diabetes mellitus without complication ([place])   . Dysmenorrhea   . Endometriosis   . GERD (gastroesophageal reflux disease)   . Hiatal hernia   . History of abnormal cervical Pap smear   . Proteinuria   . Thyroid nodule   . Thyromegaly      Past Surgical History:  Procedure Laterality Date  . BREAST SURGERY  06/2014   scar revision of left side  . COLPOSCOPY    . ESOPHAGOGASTRODUODENOSCOPY ENDOSCOPY    . LAPAROSCOPY    . LEEP    . reduction mammoplasty Bilateral   . RHINOPLASTY     deviated septum  . sweat glands removed under left arm      Family History  Problem Relation Age of Onset  . Hypertension Mother   . Prostate cancer Paternal Uncle   . Brain cancer Paternal Grandfather 60  . Breast cancer Other 50  . Colon cancer Other     Social History   Socioeconomic History  . Marital status: Single    Spouse name: Not on file  . Number of children: Not on file  . Years of education: Not on file  . Highest education level: Not on file  Occupational History  . Not on file  Tobacco Use  . Smoking status: Never Smoker  . Smokeless tobacco: Never Used  Substance and Sexual Activity  . Alcohol use: No  . Drug use: No  . Sexual activity: Yes    Birth control/protection: None  Other Topics Concern  . Not on  file  Social History Narrative  . Not on file   Social Determinants of Health   Financial Resource Strain:   . Difficulty of Paying Living Expenses: Not on file  Food Insecurity:   . Worried About Charity fundraiser in the Last Year: Not on file  . Ran Out of Food in the Last Year: Not on file  Transportation Needs:   . Lack of Transportation (Medical): Not on file  . Lack of Transportation (Non-Medical): Not on file  Physical Activity:   . Days of Exercise per Week: Not on file  . Minutes of Exercise per Session: Not on file  Stress:   . Feeling of Stress : Not on file  Social Connections:   . Frequency of Communication with Friends and Family: Not on file  . Frequency of Social Gatherings with Friends and Family: Not on file  . Attends Religious Services: Not on file  . Active Member of Clubs or Organizations: Not on file  . Attends Archivist Meetings: Not on file  . Marital Status: Not on file  Intimate Partner Violence:   . Fear of Current or Ex-Partner: Not on file  . Emotionally Abused:  Not on file  . Physically Abused: Not on file  . Sexually Abused: Not on file    Outpatient Medications Prior to Visit  Medication Sig Dispense Refill  . cetirizine (ZYRTEC) 10 MG tablet TAKE 1 TABLET BY MOUTH EVERY DAY (NOT COVER)    . escitalopram (LEXAPRO) 20 MG tablet TAKE 1 TABLET (20 MG TOTAL) BY MOUTH DAILY.  3  . gabapentin (NEURONTIN) 600 MG tablet Take by mouth.    Marland Kitchen glipiZIDE (GLUCOTROL XL) 10 MG 24 hr tablet Take 10 mg by mouth.    Marland Kitchen omeprazole (PRILOSEC) 20 MG capsule TAKE 1 CAPSULE (20 MG TOTAL) BY MOUTH DAILY.  1  . ONE TOUCH ULTRA TEST test strip TEST BLOOD SUGARS TID. PLEASE DISPENSE PER INSURANCE COVERAGE. ICD-10 E11.9  9  . ONETOUCH DELICA LANCETS 99991111 MISC 1 EACH BY OTHER ROUTE THREE (3) TIMES A DAY.  99  . pioglitazone (ACTOS) 30 MG tablet Take by mouth.    . fluticasone (FLONASE) 50 MCG/ACT nasal spray 1 spray by Each Nare route daily.    . traZODone  (DESYREL) 50 MG tablet Take by mouth.     No facility-administered medications prior to visit.      ROS:  Review of Systems  Constitutional: Negative for fever.  Gastrointestinal: Negative for blood in stool, constipation, diarrhea, nausea and vomiting.  Genitourinary: Positive for vaginal discharge. Negative for dyspareunia, dysuria, flank pain, frequency, hematuria, urgency, vaginal bleeding and vaginal pain.  Musculoskeletal: Negative for back pain.  Skin: Negative for rash.   BREAST: No symptoms   OBJECTIVE:   Vitals:  BP 120/90   Ht 5\' 3"  (1.6 m)   Wt 247 lb (112 kg)   LMP 02/01/2019 (Approximate)   BMI 43.75 kg/m   Physical Exam Vitals reviewed.  Constitutional:      Appearance: She is well-developed.  Pulmonary:     Effort: Pulmonary effort is normal.  Genitourinary:    Pubic Area: No rash.      Labia:        Right: No rash, tenderness or lesion.        Left: No rash, tenderness or lesion.      Vagina: Vaginal discharge present. No erythema or tenderness.     Cervix: Normal.     Uterus: Normal. Not enlarged and not tender.      Adnexa: Right adnexa normal and left adnexa normal.       Right: No mass or tenderness.         Left: No mass or tenderness.       Comments: ERYTHEMA BILAT LABIA MINORA Musculoskeletal:        General: Normal range of motion.     Cervical back: Normal range of motion.  Skin:    General: Skin is warm and dry.  Neurological:     General: No focal deficit present.     Mental Status: She is alert and oriented to person, place, and time.  Psychiatric:        Mood and Affect: Mood normal.        Behavior: Behavior normal.        Thought Content: Thought content normal.        Judgment: Judgment normal.     Results: Results for orders placed or performed in visit on 02/25/19 (from the past 24 hour(s))  POCT Wet Prep with KOH     Status: Abnormal   Collection Time: 02/25/19  4:28 PM  Result Value Ref Range  Trichomonas, UA  Negative    Clue Cells Wet Prep HPF POC neg    Epithelial Wet Prep HPF POC     Yeast Wet Prep HPF POC pos    Bacteria Wet Prep HPF POC     RBC Wet Prep HPF POC     WBC Wet Prep HPF POC     KOH Prep POC Negative Negative     Assessment/Plan: Candidal vaginitis - Plan: POCT Wet Prep with KOH, fluconazole (DIFLUCAN) 150 MG tablet; Pos sx and wet prep. Treating with monistat-7. Cont crm, add diflucan. Rx eRxd. F/u prn.    Meds ordered this encounter  Medications  . fluconazole (DIFLUCAN) 150 MG tablet    Sig: Take 1 tablet (150 mg total) by mouth once for 1 dose.    Dispense:  1 tablet    Refill:  0    Order Specific Question:   Supervising Provider    Answer:   Gae Dry [id number]      Return if symptoms worsen or fail to improve.  Richel Millspaugh B. Shuronda Santino, PA-C 02/25/2019 4:29 PM

## 2019-03-03 ENCOUNTER — Other Ambulatory Visit: Payer: Self-pay | Admitting: Obstetrics and Gynecology

## 2019-03-03 ENCOUNTER — Telehealth: Payer: Self-pay

## 2019-03-03 DIAGNOSIS — B3731 Acute candidiasis of vulva and vagina: Secondary | ICD-10-CM

## 2019-03-03 DIAGNOSIS — B373 Candidiasis of vulva and vagina: Secondary | ICD-10-CM

## 2019-03-03 MED ORDER — FLUCONAZOLE 150 MG PO TABS: 150.0000 mg | ORAL_TABLET | Freq: Once | ORAL | 0 refills | Status: DC

## 2019-03-03 NOTE — Telephone Encounter (Signed)
Rx diflucan eRxd. Can also finish leftover monistat crm once stops bleeding. F/u prn.

## 2019-03-03 NOTE — Telephone Encounter (Signed)
Pt aware.

## 2019-03-03 NOTE — Telephone Encounter (Signed)
Pt calling; was seen 2/10 for yeast inf; was rx'd diflucan and told to finish monistat; wasn't able to finish monistat b/c period came on right after visit; still having sxs; wants to know if another diflucan can be called in or what to do b/c cycle is still on.  [phone]

## 2019-03-03 NOTE — Progress Notes (Signed)
Rx RF diflucan for yeast vag sx 

## 2019-03-12 ENCOUNTER — Telehealth: Payer: Self-pay

## 2019-03-12 NOTE — Telephone Encounter (Signed)
[facility] pt recently found bump in breast. Pt inquiring if she should see [facility] or contact PCP. She has had a breast reduction in the same area of where the symptoms are. [id number]

## 2019-03-12 NOTE — Telephone Encounter (Signed)
Pls. call pt to schedule appt..

## 2019-03-12 NOTE — Telephone Encounter (Signed)
Appt with [place]. Pls call pt to sched.

## 2019-03-13 NOTE — Telephone Encounter (Signed)
Patient is schedule for 04/10/19 

## 2019-03-16 ENCOUNTER — Ambulatory Visit: Payer: BC Managed Care – PPO | Admitting: Obstetrics and Gynecology

## 2019-03-19 ENCOUNTER — Encounter: Payer: Self-pay | Admitting: Obstetrics and Gynecology

## 2019-03-19 ENCOUNTER — Ambulatory Visit (INDEPENDENT_AMBULATORY_CARE_PROVIDER_SITE_OTHER): Payer: BC Managed Care – PPO | Admitting: Obstetrics and Gynecology

## 2019-03-19 ENCOUNTER — Other Ambulatory Visit: Payer: Self-pay

## 2019-03-19 VITALS — BP 120/90 | Ht 63.0 in | Wt 244.0 lb

## 2019-03-19 DIAGNOSIS — Z1231 Encounter for screening mammogram for malignant neoplasm of breast: Secondary | ICD-10-CM | POA: Diagnosis not present

## 2019-03-19 DIAGNOSIS — Z803 Family history of malignant neoplasm of breast: Secondary | ICD-10-CM

## 2019-03-19 DIAGNOSIS — N6310 Unspecified lump in the right breast, unspecified quadrant: Secondary | ICD-10-CM

## 2019-03-19 NOTE — Progress Notes (Signed)
Kimel-Scott, Santiago Glad, MD   Chief Complaint  Patient presents with  . Breast exam    pebble size hard spot on RB noticed last week not tender    HPI:      [name] is a 39 y.o. G3P0030 who LMP was Patient's last menstrual period was 02/27/2019 (approximate)., presents today for RT breast mass since last wk. Always has an area that feels like a ball (~18mm) in scar line from breast reduction but sometimes gets larger and then returns to normal size. Not sure if any relation to menses. This time area got larger and is now size of tootsie roll. Never been this size before. Not tender, no trauma, erythema, nipple d/c. Denies caffeine use. Last mammo 2016 in our office.  Fraser Din grt aunt with breast cancer.   Past Medical History:  Diagnosis Date  . Cellulitis 11/2016   lower abdomen  . Cervical dysplasia    CIN II  . Diabetes mellitus without complication ([place])   . Dysmenorrhea   . Endometriosis   . GERD (gastroesophageal reflux disease)   . Hiatal hernia   . History of abnormal cervical Pap smear   . Proteinuria   . Thyroid nodule   . Thyromegaly     Past Surgical History:  Procedure Laterality Date  . BREAST SURGERY  06/2014   scar revision of left side  . COLPOSCOPY    . ESOPHAGOGASTRODUODENOSCOPY ENDOSCOPY    . LAPAROSCOPY    . LEEP    . reduction mammoplasty Bilateral   . RHINOPLASTY     deviated septum  . sweat glands removed under left arm      Family History  Problem Relation Age of Onset  . Hypertension Mother   . Prostate cancer Paternal Uncle   . Brain cancer Paternal Grandfather 25  . Breast cancer Other 50  . Colon cancer Other     Social History   Socioeconomic History  . Marital status: Single    Spouse name: Not on file  . Number of children: Not on file  . Years of education: Not on file  . Highest education level: Not on file  Occupational History  . Not on file  Tobacco Use  . Smoking status: Never Smoker  . Smokeless tobacco:  Never Used  Substance and Sexual Activity  . Alcohol use: No  . Drug use: No  . Sexual activity: Yes    Birth control/protection: None  Other Topics Concern  . Not on file  Social History Narrative  . Not on file   Social Determinants of Health   Financial Resource Strain:   . Difficulty of Paying Living Expenses: Not on file  Food Insecurity:   . Worried About Charity fundraiser in the Last Year: Not on file  . Ran Out of Food in the Last Year: Not on file  Transportation Needs:   . Lack of Transportation (Medical): Not on file  . Lack of Transportation (Non-Medical): Not on file  Physical Activity:   . Days of Exercise per Week: Not on file  . Minutes of Exercise per Session: Not on file  Stress:   . Feeling of Stress : Not on file  Social Connections:   . Frequency of Communication with Friends and Family: Not on file  . Frequency of Social Gatherings with Friends and Family: Not on file  . Attends Religious Services: Not on file  . Active Member of Clubs or Organizations: Not on  file  . Attends Archivist Meetings: Not on file  . Marital Status: Not on file  Intimate Partner Violence:   . Fear of Current or Ex-Partner: Not on file  . Emotionally Abused: Not on file  . Physically Abused: Not on file  . Sexually Abused: Not on file    Outpatient Medications Prior to Visit  Medication Sig Dispense Refill  . cetirizine (ZYRTEC) 10 MG tablet TAKE 1 TABLET BY MOUTH EVERY DAY (NOT COVER)    . escitalopram (LEXAPRO) 20 MG tablet TAKE 1 TABLET (20 MG TOTAL) BY MOUTH DAILY.  3  . gabapentin (NEURONTIN) 600 MG tablet Take by mouth.    Marland Kitchen glipiZIDE (GLUCOTROL XL) 10 MG 24 hr tablet Take 10 mg by mouth.    Marland Kitchen omeprazole (PRILOSEC) 20 MG capsule TAKE 1 CAPSULE (20 MG TOTAL) BY MOUTH DAILY.  1  . ONE TOUCH ULTRA TEST test strip TEST BLOOD SUGARS TID. PLEASE DISPENSE PER INSURANCE COVERAGE. ICD-10 E11.9  9  . ONETOUCH DELICA LANCETS 99991111 MISC 1 EACH BY OTHER ROUTE THREE  (3) TIMES A DAY.  99  . pioglitazone (ACTOS) 30 MG tablet Take by mouth.     No facility-administered medications prior to visit.      ROS:  Review of Systems  Constitutional: Negative for fever.  Gastrointestinal: Negative for blood in stool, constipation, diarrhea, nausea and vomiting.  Genitourinary: Negative for dyspareunia, dysuria, flank pain, frequency, hematuria, urgency, vaginal bleeding, vaginal discharge and vaginal pain.  Musculoskeletal: Negative for back pain.  Skin: Negative for rash.  BREAST: mass   OBJECTIVE:   Vitals:  BP 120/90   Ht 5\' 3"  (1.6 m)   Wt 244 lb (110.7 kg)   LMP 02/27/2019 (Approximate)   BMI 43.22 kg/m   Physical Exam Vitals reviewed.  Pulmonary:     Effort: Pulmonary effort is normal.  Chest:     Breasts: Breasts are symmetrical.        Right: No inverted nipple, mass, nipple discharge, skin change or tenderness.        Left: No inverted nipple, mass, nipple discharge, skin change or tenderness.    Musculoskeletal:        General: Normal range of motion.     Cervical back: Normal range of motion.  Skin:    General: Skin is warm and dry.  Neurological:     General: No focal deficit present.     Mental Status: She is alert and oriented to person, place, and time.     Cranial Nerves: No cranial nerve deficit.  Psychiatric:        Mood and Affect: Mood normal.        Behavior: Behavior normal.        Thought Content: Thought content normal.        Judgment: Judgment normal.     Assessment/Plan: Breast mass, right - Plan: MM DIAG BREAST TOMO BILATERAL, US BREAST LTD UNI LEFT INC AXILLA, US BREAST LTD UNI RIGHT INC AXILLA; RT breast at 4:00 position. Most likely scar tissue but check dx mammo and u/s. Will f/u with results.   Encounter for screening mammogram for malignant neoplasm of breast - Plan: MM DIAG BREAST TOMO BILATERAL, US BREAST LTD UNI LEFT INC AXILLA, US BREAST LTD UNI RIGHT INC AXILLA   Has annual next wk.     Return if symptoms worsen or fail to improve.  [name], PA-C 03/19/2019 10:50 AM

## 2019-03-19 NOTE — Patient Instructions (Signed)
I value your feedback and entrusting us with your care. If you get a [facility] patient survey, I would appreciate you taking the time to let us know about your experience today. Thank you!  As of December 25, 2018, your lab results will be released to your MyChart immediately, before I even have a chance to see them. Please give me time to review them and contact you if there are any abnormalities. Thank you for your patience.  

## 2019-03-27 ENCOUNTER — Ambulatory Visit: Payer: BC Managed Care – PPO | Admitting: Certified Nurse Midwife

## 2019-03-30 ENCOUNTER — Ambulatory Visit
Admission: RE | Admit: 2019-03-30 | Discharge: 2019-03-30 | Disposition: A | Discharge status: Home / Self Care | Payer: BC Managed Care – PPO | Source: Ambulatory Visit | Attending: Obstetrics and Gynecology | Admitting: Obstetrics and Gynecology

## 2019-03-30 DIAGNOSIS — N6310 Unspecified lump in the right breast, unspecified quadrant: Secondary | ICD-10-CM

## 2019-03-30 DIAGNOSIS — Z1231 Encounter for screening mammogram for malignant neoplasm of breast: Secondary | ICD-10-CM

## 2019-04-22 NOTE — Progress Notes (Addendum)
PCP:  Erline Levine, MD   Chief Complaint  Patient presents with  . Gynecologic Exam     HPI:      [name] is a 39 y.o.  G3 P0030 whose last menstrual period was 04/23/2019 (exact date)., presents today for her annual examination.   She was on cont dosing of OCPs for endometriosis in past, then switched to Depo Provera after she was diagnosed with gallstones and hepatic adenomas. She stopped using Depo in 2019 in hopes of conceiving.  Had 2 Mirenas in the past but they both "fell out".  Currently her menses are regular. They occur every month, last 5-7 days, with 2-3 heavier days requiring a pad change every 2-3 hours. She denies cramping  Sex activity: single partner, contraception - none. Wants to conceive. Not taking PNVs. Hx of first trimester loss in 2007 and then a second trimester loss at 19 weeks when she presented with painless dilation and chorioamnionitis. 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 There is no FH of ovarian cancer. The patient does do self-breast exams. She was seen last month for a breast mass in her lower inner right breast. A diagnostic mammogram and ultrasound revealed a 2.8cm x 1cm cystic structure (sebaccous vs inclusion cyst),  She has a history of having a breast reduction. Her medical history is also remarkable for T2DM (last hemoglobin A1C was 6.6%), thyromegaly (multinodular goiter/ negative biopsies/ followed at [facility]), anemia, depression, obesity, and restless leg syndrome.  Tobacco use: The patient denies current or previous tobacco use. Alcohol use: social (2-3 drinks/month) No drug use.  Exercise: plays softball  She may get adequate calcium and Vitamin D in her diet.  Labs with PCP.  Pt has noticed urge incontinence-mostly in the morning when she first  awakes.  Drinks caffeine daily, some water.   Past Medical History:  Diagnosis Date  . Cellulitis 11/2016   lower abdomen  . Cervical dysplasia    CIN II  . Diabetes mellitus without complication ([place])   . Dysmenorrhea   . Endometriosis   . GERD (gastroesophageal reflux disease)   . Hepatic adenoma   . Hiatal hernia   . History of abnormal cervical Pap smear   . Proteinuria   . Thyroid nodule   . Thyromegaly     Past Surgical History:  Procedure Laterality Date  . BREAST BIOPSY    . BREAST SURGERY  06/2014   scar revision of left side  . COLPOSCOPY    . ESOPHAGOGASTRODUODENOSCOPY ENDOSCOPY    . LAPAROSCOPY    . LEEP    . REDUCTION MAMMAPLASTY    . reduction mammoplasty Bilateral   . RHINOPLASTY     deviated septum  . sweat glands removed under left arm      Family History  Problem Relation Age of Onset  . Hypertension Mother   . Prostate cancer Paternal Uncle   . Brain cancer Paternal Grandfather 28  . Breast cancer Other 50  . Colon cancer Other     Social History   Socioeconomic History  . Marital status: Single    Spouse name: Not on file  . Number of children: Not on file  . Years of education: Not on file  . Highest education level: Not on file  Occupational History  . Not on file  Tobacco Use  . Smoking status: Never Smoker  . Smokeless tobacco: Never Used  Substance and Sexual Activity  . Alcohol use: No  . Drug use: No  . Sexual activity: Yes    Birth control/protection: None  Other Topics Concern  . Not on file  Social History Narrative  . Not on file   Social Determinants of Health   Financial Resource Strain:   . Difficulty of Paying Living Expenses:   Food Insecurity:   . Worried About Charity fundraiser in the Last Year:   . Arboriculturist in the Last Year:   Transportation Needs:   . Film/video editor (Medical):   Marland Kitchen Lack of Transportation (Non-Medical):   Physical Activity:   . Days of Exercise per Week:   . Minutes  of Exercise per Session:   Stress:   . Feeling of Stress :   Social Connections:   . Frequency of Communication with Friends and Family:   . Frequency of Social Gatherings with Friends and Family:   . Attends Religious Services:   . Active Member of Clubs or Organizations:   . Attends Archivist Meetings:   Marland Kitchen Marital Status:   Intimate Partner Violence:   . Fear of Current or Ex-Partner:   . Emotionally Abused:   Marland Kitchen Physically Abused:   . Sexually Abused:     Current Meds  Medication Sig  . cetirizine (ZYRTEC) 10 MG tablet TAKE 1 TABLET BY MOUTH EVERY DAY (NOT COVER)  . escitalopram (LEXAPRO) 20 MG tablet TAKE 1 TABLET (20 MG TOTAL) BY MOUTH DAILY.  Marland Kitchen gabapentin (NEURONTIN) 600 MG tablet Take by mouth.  Marland Kitchen glipiZIDE (GLUCOTROL XL) 10 MG 24 hr tablet Take 10 mg by mouth.  Marland Kitchen omeprazole (PRILOSEC) 20 MG capsule TAKE 1 CAPSULE (20 MG TOTAL) BY MOUTH DAILY.  . ONE TOUCH ULTRA TEST test strip TEST BLOOD SUGARS TID. PLEASE DISPENSE PER INSURANCE COVERAGE. ICD-10 E11.9  . ONETOUCH DELICA LANCETS 99991111 MISC 1 EACH BY OTHER ROUTE THREE (3) TIMES A DAY.     ROS:  Review of Systems  Constitutional: Negative for fatigue, fever and unexpected weight change.  Respiratory: Negative for cough, shortness of breath and wheezing.   Cardiovascular: Negative for chest pain, palpitations and leg swelling.  Gastrointestinal: Negative for blood in stool, constipation, diarrhea, nausea and vomiting.  Endocrine: Negative for cold intolerance, heat intolerance and polyuria.  Genitourinary: Positive for menstrual problem (menorrhagia). Negative for dyspareunia, dysuria (and tingling in legs), flank pain, frequency, genital sores, hematuria, pelvic pain, urgency, vaginal bleeding, vaginal discharge and vaginal pain.       Urgency incontinence  Musculoskeletal: Negative for back pain, joint swelling and myalgias.  Skin: Negative for rash.  Allergic/Immunologic: Positive for environmental allergies.   Neurological: Positive for numbness. Negative for dizziness, syncope, light-headedness and headaches.  Hematological: Negative for adenopathy.  Psychiatric/Behavioral: Negative for agitation, confusion, sleep disturbance and suicidal ideas. The patient is not nervous/anxious.        Positive for depression     Objective: BP 122/80   Ht 5\' 3"  (1.6 m)   Wt 247 lb (112 kg)   LMP 04/23/2019 (Exact Date)   BMI 43.75 kg/m    Physical Exam Constitutional:      General: She is not in acute distress.    Appearance: She is well-developed.  Genitourinary:     Genitourinary Comments: Vulva: no inflammation or lesions Vagina: small amount  of blood in vagina, no lesions Cervix: pink, on menses, unable to get Pap smear today. Uterus: AV, unable to outline uterus due to body habitus, mobile, NT Adnexa: No masses, NT   Neck:     Thyroid: Thyromegaly present.     Comments: Large left thyroid lobe, at least 6-7 cm in diameter. Right lobe mildly enlarged. Cardiovascular:     Rate and Rhythm: Normal rate and regular rhythm.     Heart sounds: Normal heart sounds. No murmur.  Pulmonary:     Effort: Pulmonary effort is normal.     Breath sounds: Normal breath sounds.  Chest:     Breasts:        Right: No mass, nipple discharge, skin change or tenderness.        Left: No mass, nipple discharge, skin change or tenderness.  Abdominal:     Palpations: Abdomen is soft. There is no mass.     Tenderness: There is no abdominal tenderness. There is no guarding.  Musculoskeletal:        General: Normal range of motion.     Cervical back: Normal range of motion.  Neurological:     Mental Status: She is alert and oriented to person, place, and time.     Cranial Nerves: No cranial nerve deficit.  Skin:    General: Skin is warm and dry.  Psychiatric:        Behavior: Behavior normal.  Vitals reviewed.     Assessment/Plan: 39 year old G3 P0030 presents for annual exam Menorrhagia-will get pelvic  ultrasound and have her keep a menstrual calendar. Recommend getting a progesterone level 1 week before a menses. Will have her return of follow up and Pap smear in 4-6 weeks.  Desires to conceive-has risk factors including AMA, T2DM, hepatic adenomas (one that was almost 10 cm on 2018 MRI), obesity  Did see a [facility] for preconception counseling 11/13/2017 and she reviewed risks of a pregnancy. Was going to try to contact the hepatologist at [facility], Dr Manuella Ghazi who saw patient in 2018 for the liver lesions. He wrote that he was uncertain whether the liver lesions were hepatic adenomas or focal hepatic hyperplasia, but he felt they were most consistent with hepatic adenomas. He was going to present her case at a conference, but he never got back with patient. If this large lesion is a hepatic adenoma and it is still >5cm, there is a risk of hemorrhage during a pregnancy. Will refer to hepatology at [facility] for further evaluation Enlarged thyroid: She denies problems swallowing. She does not think the goiter has gotten larger. Last TSH 12/2018 was normal. Has had nodules in both lobes biopsied.   T2DM. Last hemoglobin A1C was <7% on her oral agents. Aware that she would most probably be switched to insulin in a pregnancy  History of a LEEP in 2007  Hx of a second trimester loss after LEEP: ? Incompetent cervix  RX for folic acid 1 mgm sent to pharmacy.   Breast cancer screening: continue annual mammograms if she is not pregnant or breast feeding  Dalia Heading, CNM

## 2019-04-23 ENCOUNTER — Ambulatory Visit (INDEPENDENT_AMBULATORY_CARE_PROVIDER_SITE_OTHER): Payer: BC Managed Care – PPO | Admitting: Certified Nurse Midwife

## 2019-04-23 ENCOUNTER — Encounter: Payer: Self-pay | Admitting: Certified Nurse Midwife

## 2019-04-23 ENCOUNTER — Other Ambulatory Visit: Payer: Self-pay

## 2019-04-23 VITALS — BP 122/80 | Ht 63.0 in | Wt 247.0 lb

## 2019-04-23 DIAGNOSIS — N920 Excessive and frequent menstruation with regular cycle: Secondary | ICD-10-CM | POA: Diagnosis not present

## 2019-04-23 DIAGNOSIS — Z01419 Encounter for gynecological examination (general) (routine) without abnormal findings: Secondary | ICD-10-CM

## 2019-04-23 DIAGNOSIS — D134 Benign neoplasm of liver: Secondary | ICD-10-CM | POA: Diagnosis not present

## 2019-04-23 DIAGNOSIS — E010 Iodine-deficiency related diffuse (endemic) goiter: Secondary | ICD-10-CM

## 2019-04-23 DIAGNOSIS — E049 Nontoxic goiter, unspecified: Secondary | ICD-10-CM

## 2019-04-23 DIAGNOSIS — Z803 Family history of malignant neoplasm of breast: Secondary | ICD-10-CM

## 2019-04-23 DIAGNOSIS — Z9889 Other specified postprocedural states: Secondary | ICD-10-CM

## 2019-04-23 DIAGNOSIS — E119 Type 2 diabetes mellitus without complications: Secondary | ICD-10-CM

## 2019-04-23 MED ORDER — FOLIC ACID 1 MG PO TABS: 1.0000 mg | ORAL_TABLET | Freq: Every day | ORAL | 10 refills | Status: DC

## 2019-04-24 ENCOUNTER — Encounter: Payer: Self-pay | Admitting: Certified Nurse Midwife

## 2019-04-24 DIAGNOSIS — D134 Benign neoplasm of liver: Secondary | ICD-10-CM | POA: Insufficient documentation

## 2019-04-24 DIAGNOSIS — E010 Iodine-deficiency related diffuse (endemic) goiter: Secondary | ICD-10-CM | POA: Insufficient documentation

## 2019-04-24 DIAGNOSIS — E119 Type 2 diabetes mellitus without complications: Secondary | ICD-10-CM | POA: Insufficient documentation

## 2019-06-04 ENCOUNTER — Other Ambulatory Visit: Payer: Self-pay

## 2019-06-04 ENCOUNTER — Ambulatory Visit (INDEPENDENT_AMBULATORY_CARE_PROVIDER_SITE_OTHER): Payer: BC Managed Care – PPO | Admitting: Certified Nurse Midwife

## 2019-06-04 ENCOUNTER — Ambulatory Visit (INDEPENDENT_AMBULATORY_CARE_PROVIDER_SITE_OTHER): Payer: BC Managed Care – PPO

## 2019-06-04 ENCOUNTER — Other Ambulatory Visit (HOSPITAL_COMMUNITY)
Admission: RE | Admit: 2019-06-04 | Discharge: 2019-06-04 | Disposition: A | Discharge status: Home / Self Care | Payer: BC Managed Care – PPO | Source: Ambulatory Visit | Attending: Certified Nurse Midwife | Admitting: Certified Nurse Midwife

## 2019-06-04 ENCOUNTER — Encounter: Payer: Self-pay | Admitting: Certified Nurse Midwife

## 2019-06-04 VITALS — BP 120/80 | Ht 63.0 in | Wt 256.0 lb

## 2019-06-04 DIAGNOSIS — N979 Female infertility, unspecified: Secondary | ICD-10-CM

## 2019-06-04 DIAGNOSIS — N920 Excessive and frequent menstruation with regular cycle: Secondary | ICD-10-CM

## 2019-06-04 DIAGNOSIS — Z124 Encounter for screening for malignant neoplasm of cervix: Secondary | ICD-10-CM | POA: Insufficient documentation

## 2019-06-04 DIAGNOSIS — R935 Abnormal findings on diagnostic imaging of other abdominal regions, including retroperitoneum: Secondary | ICD-10-CM | POA: Diagnosis not present

## 2019-06-04 DIAGNOSIS — Z87410 Personal history of cervical dysplasia: Secondary | ICD-10-CM | POA: Insufficient documentation

## 2019-06-04 NOTE — Progress Notes (Signed)
  HPI: [name] is a 39 y.o. G3 P0030 whose last menstrual period was 20 May 2019 (exact date)., who presents today to follow up after an ultrasound and for a Pap smear. She was on her menses when she presented last month for an annual exam and a Pap smear was not done at that time.   Sex activity: single partner, contraception - none. Wants to conceive. Hx of first trimester loss in 2007 and then a second trimester loss at 19 weeks when she presented with painless dilation and chorioamnionitis. 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 She has been referred back to a hepatologist and has an appointment 2 June.  Ultrasound today demonstrates an Endometrium that is not clearly visualized possibly between 7.4 to 13.2 mm. A submucosal fibroid or polyp can not be ruled out do to limited visualization. Ovaries appeared normal. Ultrasound reviewed with Dr Gilman Schmidt who recommends either biopsy or hysteroscopy with D&C. PMHx: She  has a past medical history of Cellulitis (11/2016), Cervical dysplasia, Diabetes mellitus without complication ([place]), Dysmenorrhea, Endometriosis, GERD (gastroesophageal reflux disease), Hepatic adenoma, Hiatal hernia, History of abnormal cervical Pap smear, History of loop electrical excision procedure (LEEP) (03/2005), Proteinuria, Thyroid nodule, and Thyromegaly. Also,  has a past surgical history that includes sweat glands removed under left arm; Breast surgery (06/2014); Colposcopy; Esophagogastroduodenoscopy endoscopy; laparoscopy; LEEP; reduction mammoplasty (Bilateral); Rhinoplasty; Reduction  mammaplasty; Breast biopsy; and Colonoscopy (02/24/2016)., family history includes Brain cancer (age of onset: 38) in her paternal grandfather; Breast cancer (age of onset: 25) in an other family member; Colon cancer in an other family member; Hypertension in her mother; Prostate cancer in her paternal uncle.,  reports that she has never smoked. She has never used smokeless tobacco. She reports that she does not drink alcohol or use drugs.  She has a current medication list which includes the following prescription(s): cetirizine, folic acid, gabapentin, glipizide, omeprazole, one touch ultra test, onetouch delica lancets 0000000, venlafaxine hcl, escitalopram, and pioglitazone. Also, is allergic to sulfa antibiotics and sulfamethoxazole-trimethoprim.  ROS  Objective: BP 120/80   Ht 5\' 3"  (1.6 m)   Wt 256 lb (116.1 kg)   LMP 05/16/1949   BMI 45.35 kg/m   Physical examination Constitutional NAD, Conversant  Skin Inner thighs hyperpigmented  Pelvic External/BUS: no lesions or inflammation Vagina: white mucoepithelial discharge Cervix: no lesions. Pap smear done  Neuro: Grossly intact  Psych: Oriented to PPT.  Normal mood. Normal affect.   Assessment: 1) Hx of dysplasia and LEEP 2) Infertility with abnormal appearing endometrial stripe 3) Hx of hepatic adenomas  Plan: 1) Pap smear 2) Schedule an appointment with Dr Gilman Schmidt to discuss endometrial biopsy vs hysteroscopy. This is to be scheduled after her appointment with the hepatologist regarding her hepatic adenomas 3) Progesterone level on 25 May.   Dalia Heading, CNM

## 2019-06-08 LAB — CYTOLOGY - PAP
Adequacy: ABSENT
Comment: NEGATIVE
Diagnosis: NEGATIVE
High risk HPV: NEGATIVE

## 2019-06-09 ENCOUNTER — Other Ambulatory Visit: Payer: Self-pay

## 2019-06-09 ENCOUNTER — Other Ambulatory Visit: Payer: BC Managed Care – PPO

## 2019-06-09 DIAGNOSIS — N979 Female infertility, unspecified: Secondary | ICD-10-CM

## 2019-06-10 ENCOUNTER — Other Ambulatory Visit: Payer: Self-pay | Admitting: Certified Nurse Midwife

## 2019-06-10 LAB — PROGESTERONE: Progesterone: 7.1 ng/mL

## 2019-06-10 MED ORDER — NYSTATIN-TRIAMCINOLONE 100000-0.1 UNIT/GM-% EX OINT: 1.0000 "application " | TOPICAL_OINTMENT | Freq: Two times a day (BID) | CUTANEOUS | 0 refills | Status: DC

## 2019-06-10 MED ORDER — FLUCONAZOLE 150 MG PO TABS: 150.0000 mg | ORAL_TABLET | Freq: Once | ORAL | 0 refills | Status: AC

## 2019-06-29 ENCOUNTER — Other Ambulatory Visit: Payer: Self-pay

## 2019-06-29 ENCOUNTER — Encounter: Payer: Self-pay | Admitting: Obstetrics and Gynecology

## 2019-06-29 ENCOUNTER — Ambulatory Visit (INDEPENDENT_AMBULATORY_CARE_PROVIDER_SITE_OTHER): Payer: BC Managed Care – PPO | Admitting: Obstetrics and Gynecology

## 2019-06-29 VITALS — BP 136/70 | Ht 63.0 in | Wt 249.0 lb

## 2019-06-29 DIAGNOSIS — N979 Female infertility, unspecified: Secondary | ICD-10-CM | POA: Diagnosis not present

## 2019-06-29 DIAGNOSIS — N840 Polyp of corpus uteri: Secondary | ICD-10-CM | POA: Diagnosis not present

## 2019-06-29 DIAGNOSIS — Z8742 Personal history of other diseases of the female genital tract: Secondary | ICD-10-CM | POA: Diagnosis not present

## 2019-06-29 NOTE — Progress Notes (Signed)
Patient ID: Melissa Schroeder, female   DOB: 1980-02-28, 39 y.o.   MRN: [id number]  Reason for Consult: Gynecologic Exam (follow up poss [place])   Referred by Erline Levine, MD  Subjective:     HPI:  Melissa Schroeder is a 39 y.o. female She was referred today by Dalia Heading for irregular endometrium concern for submucosal polyp or fibroid on recent pelvic US.  Patient reports that she has been trying to conceive for the last year a a half without success. She has a history of endometriosis and was treated in the past with Lupron, Depo-Provera, and extended cycle OCP. She stopped the OCP in December 2018 because of concerns related to a liver mass. She took Depo-Provera  Which she stopped in 2019.   She reports she has a MRI tomorrow for her liver mass and follow up afterwards with Dr. Brigitte Pulse.  Gynecological History LMP: 06/15/2019 Describes periods as monthly, lasting for 5-7 days. Moderate bleeding Last pap smear: 06/04/2019- NIL History of STDs: Denies Sexually Active: Yes, only see's her partner on the weekends.  He has 1 previous child. Partner is 50. They have not previously had a child together.  Obstetrical History History of a 18 week 5 day delivery in 2008. She reports she had painless dilation in this pregnancy. She had a LEEP which was performed 1 year before she conceived. Reports that a cerclage was considered but there ewas conern for chorioamnionitis.   31- Miscarriage, patner #1 2007- Miscarriage, partner #2 2008- Second trimester miscarriage, partner #2   Past Medical History:  Diagnosis Date  . Cellulitis 11/2016   lower abdomen  . Cervical dysplasia    CIN II  . Diabetes mellitus without complication ([place])   . Dysmenorrhea   . Endometriosis   . GERD (gastroesophageal reflux disease)   . Hepatic adenoma   . Hiatal hernia   . History of abnormal cervical Pap smear   . History of loop electrical excision procedure (LEEP) 03/2005   CIN 2  . Proteinuria     . Thyroid nodule   . Thyromegaly    Family History  Problem Relation Age of Onset  . Hypertension Mother   . Prostate cancer Paternal Uncle   . Brain cancer Paternal Grandfather 42  . Breast cancer Other 50  . Colon cancer Other    Past Surgical History:  Procedure Laterality Date  . BREAST BIOPSY    . BREAST SURGERY  06/2014   scar revision of left side  . COLONOSCOPY  02/24/2016  . COLPOSCOPY    . ESOPHAGOGASTRODUODENOSCOPY ENDOSCOPY    . LAPAROSCOPY    . LEEP    . REDUCTION MAMMAPLASTY    . reduction mammoplasty Bilateral   . RHINOPLASTY     deviated septum  . sweat glands removed under left arm      Short Social History:  Social History   Tobacco Use  . Smoking status: Never Smoker  . Smokeless tobacco: Never Used  Substance Use Topics  . Alcohol use: No    Allergies  Allergen Reactions  . Sulfa Antibiotics Rash  . Sulfamethoxazole-Trimethoprim Rash    Current Outpatient Medications  Medication Sig Dispense Refill  . cetirizine (ZYRTEC) 10 MG tablet TAKE 1 TABLET BY MOUTH EVERY DAY (NOT COVER)    . escitalopram (LEXAPRO) 20 MG tablet TAKE 1 TABLET (20 MG TOTAL) BY MOUTH DAILY.  3  . folic acid (FOLVITE) 1 MG tablet Take 1 tablet (1 mg total) by mouth  daily. 30 tablet 10  . gabapentin (NEURONTIN) 600 MG tablet Take by mouth.    Marland Kitchen glipiZIDE (GLUCOTROL XL) 10 MG 24 hr tablet Take 10 mg by mouth.    . nystatin-triamcinolone ointment (MYCOLOG) Apply 1 application topically 2 (two) times daily. 30 g 0  . omeprazole (PRILOSEC) 20 MG capsule TAKE 1 CAPSULE (20 MG TOTAL) BY MOUTH DAILY.  1  . ONE TOUCH ULTRA TEST test strip TEST BLOOD SUGARS TID. PLEASE DISPENSE PER INSURANCE COVERAGE. ICD-10 E11.9  9  . ONETOUCH DELICA LANCETS 70B MISC 1 EACH BY OTHER ROUTE THREE (3) TIMES A DAY.  99  . VENLAFAXINE HCL ER PO Take by mouth.    . pioglitazone (ACTOS) 30 MG tablet Take by mouth.     No current facility-administered medications for this visit.    Review of  Systems  Constitutional: Negative for chills, fatigue, fever and unexpected weight change.  HENT: Negative for trouble swallowing.  Eyes: Negative for loss of vision.  Respiratory: Negative for cough, shortness of breath and wheezing.  Cardiovascular: Negative for chest pain, leg swelling, palpitations and syncope.  GI: Negative for abdominal pain, blood in stool, diarrhea, nausea and vomiting.  GU: Negative for difficulty urinating, dysuria, frequency and hematuria.  Musculoskeletal: Negative for back pain, leg pain and joint pain.  Skin: Negative for rash.  Neurological: Negative for dizziness, headaches, light-headedness, numbness and seizures.  Psychiatric: Negative for behavioral problem, confusion, depressed mood and sleep disturbance.        Objective:  Objective   Vitals:   06/29/19 0823  BP: 136/70  Weight: 249 lb (112.9 kg)  Height: 5\' 3"  (1.6 m)   Body mass index is 44.11 kg/m.  Physical Exam Vitals and nursing note reviewed.  Constitutional:      Appearance: She is well-developed.  HENT:     Head: Normocephalic and atraumatic.  Eyes:     Pupils: Pupils are equal, round, and reactive to light.  Cardiovascular:     Rate and Rhythm: Normal rate and regular rhythm.  Pulmonary:     Effort: Pulmonary effort is normal. No respiratory distress.  Skin:    General: Skin is warm and dry.  Neurological:     Mental Status: She is alert and oriented to person, place, and time.  Psychiatric:        Behavior: Behavior normal.        Thought Content: Thought content normal.        Judgment: Judgment normal.         Assessment/Plan:     39 yo with irregular endometrium and history of infertility.  Discussed options for a HSG or a chromotubation with laparoscopy to evaluate for tubal factor pathology contributing to infertility.  Discussed options for evaluation of endometrium.  She would like to have a hysteroscopy D&C to evaluate her endometrium and a laparoscopy  with chromotubation to evaluate for tubal patency. Message sent to surgical scheduler.  Discussed timing of intercourse for optimal chances of conceiving.  Encouraged initiation of PNV.  Noted after visit patient does have a history of diabetes. Will need to discuss with patient that improving glucose control can increase likelihood of conception and improve pregnancy outcomes. Last hgba1c 06/01/2019: 6.9.    More than 45 minutes were spent face to face with the patient in the room, reviewing the medical record, labs and images, and coordinating care for the patient. The plan of management was discussed in detail and counseling was provided.  Adrian Prows MD [facility], [facility] 06/29/2019 9:30 AM

## 2019-06-29 NOTE — Patient Instructions (Signed)
Hysteroscopy Hysteroscopy is a procedure that is used to examine the inside of a woman's womb (uterus). This may be done for various reasons, including:  To look for lumps (tumors) and other growths in the uterus.  To evaluate abnormal bleeding, fibroid tumors, polyps, scar tissue (adhesions), or cancer of the uterus.  To determine the cause of an inability to get pregnant (infertility) or repeated losses of pregnancies (miscarriages).  To find a lost IUD (intrauterine device).  To perform a procedure that permanently prevents pregnancy (sterilization). During this procedure, a thin, flexible tube with a small light and camera (hysteroscope) is used to examine the uterus. The camera sends images to a monitor in the room so that your health care provider can view the inside of your uterus. A hysteroscopy should be done right after a menstrual period to make sure that you are not pregnant. Tell a health care provider about:  Any allergies you have.  All medicines you are taking, including vitamins, herbs, eye drops, creams, and over-the-counter medicines.  Any problems you or family members have had with the use of anesthetic medicines.  Any blood disorders you have.  Any surgeries you have had.  Any medical conditions you have.  Whether you are pregnant or may be pregnant. What are the risks? Generally, this is a safe procedure. However, problems may occur, including:  Excessive bleeding.  Infection.  Damage to the uterus or other structures or organs.  Allergic reaction to medicines or fluids that are used in the procedure. What happens before the procedure? Staying hydrated Follow instructions from your health care provider about hydration, which may include:  Up to 2 hours before the procedure - you may continue to drink clear liquids, such as water, clear fruit juice, black coffee, and plain tea. Eating and drinking restrictions Follow instructions from your health care  provider about eating and drinking, which may include:  8 hours before the procedure - stop eating solid foods and drink clear liquids only  2 hours before the procedure - stop drinking clear liquids. General instructions  Ask your health care provider about: ? Changing or stopping your normal medicines. This is important if you take diabetes medicines or blood thinners. ? Taking medicines such as aspirin and ibuprofen. These medicines can thin your blood and cause bleeding. Do not take these medicines for 1 week before your procedure, or as told by your health care provider.  Do not use any products that contain nicotine or tobacco for 2 weeks before the procedure. This includes cigarettes and e-cigarettes. If you need help quitting, ask your health care provider.  Medicine may be placed in your cervix the day before the procedure. This medicine causes the cervix to have a larger opening (dilate). The larger opening makes it easier for the hysteroscope to be inserted into the uterus during the procedure.  Plan to have someone with you for the first 24-48 hours after the procedure, especially if you are given a medicine to make you fall asleep (general anesthetic).  Plan to have someone take you home from the hospital or clinic. What happens during the procedure?  To lower your risk of infection: ? Your health care team will wash or sanitize their hands. ? Your skin will be washed with soap. ? Hair may be removed from the surgical area.  An IV tube will be inserted into one of your veins.  You may be given one or more of the following: ? A medicine to help  you relax (sedative). ? A medicine that numbs the area around the cervix (local anesthetic). ? A medicine to make you fall asleep (general anesthetic).  A hysteroscope will be inserted through your vagina and into your uterus.  Air or fluid will be used to enlarge your uterus, enabling your health care provider to see your uterus  better. The amount of fluid used will be carefully checked throughout the procedure.  In some cases, tissue may be gently scraped from inside the uterus and sent to a lab for testing (biopsy). The procedure may vary among health care providers and hospitals. What happens after the procedure?  Your blood pressure, heart rate, breathing rate, and blood oxygen level will be monitored until the medicines you were given have worn off.  You may have some cramping. You may be given medicines for this.  You may have bleeding, which varies from light spotting to menstrual-like bleeding. This is normal.  If you had a biopsy done, it is your responsibility to get the results of your procedure. Ask your health care provider, or the department performing the procedure, when your results will be ready. Summary  Hysteroscopy is a procedure that is used to examine the inside of a woman's womb (uterus).  After the procedure, you may have bleeding, which varies from light spotting to menstrual-like bleeding. This is normal. You may also have cramping.  Plan to have someone take you home from the hospital or clinic. This information is not intended to replace advice given to you by your health care provider. Make sure you discuss any questions you have with your health care provider. Document Revised: 12/14/2016 Document Reviewed: 01/31/2016 Elsevier Patient Education  2020 [place]. Diagnostic Laparoscopy Diagnostic laparoscopy is a procedure to diagnose diseases in the abdomen. It might be done for a variety of reasons, such as to look for scar tissue, cancer, or a reason for abdomen (abdominal) pain. During the procedure, a thin, flexible tube that has a light and a camera on the end (laparoscope) is inserted through an incision in the abdomen. The image from the camera is shown on a monitor to help your surgeon see inside your body. Tell a health care provider about:  Any allergies you have.  All  medicines you are taking, including vitamins, herbs, eye drops, creams, and over-the-counter medicines.  Any problems you or family members have had with anesthetic medicines.  Any blood disorders you have.  Any surgeries you have had.  Any medical conditions you have. What are the risks? Generally, this is a safe procedure. However, problems may occur, including:  Infection.  Bleeding.  Allergic reactions to medicines or dyes.  Damage to abdominal structures or organs, such as the intestines, liver, stomach, or spleen. What happens before the procedure? Medicines  Ask your health care provider about: ? Changing or stopping your regular medicines. This is especially important if you are taking diabetes medicines or blood thinners. ? Taking medicines such as aspirin and ibuprofen. These medicines can thin your blood. Do not take these medicines unless your health care provider tells you to take them. ? Taking over-the-counter medicines, vitamins, herbs, and supplements.  You may be given antibiotic medicine to help prevent infection. Staying hydrated Follow instructions from your health care provider about hydration, which may include:  Up to 2 hours before the procedure - you may continue to drink clear liquids, such as water, clear fruit juice, black coffee, and plain tea. Eating and drinking restrictions Follow instructions  from your health care provider about eating and drinking, which may include:  8 hours before the procedure - stop eating heavy meals or foods such as meat, fried foods, or fatty foods.  6 hours before the procedure - stop eating light meals or foods, such as toast or cereal.  6 hours before the procedure - stop drinking milk or drinks that contain milk.  2 hours before the procedure - stop drinking clear liquids. General instructions  Ask your health care provider how your surgical site will be marked or identified.  You may be asked to shower with  a germ-killing soap.  Plan to have someone take you home from the hospital or clinic.  Plan to have a responsible adult care for you for at least 24 hours after you leave the hospital or clinic. This is important. What happens during the procedure?   To lower your risk of infection: ? Your health care team will wash or sanitize their hands. ? Hair may be removed from the surgical area. ? Your skin will be washed with soap.  An IV will be inserted into one of your veins.  You will be given a medicine to make you fall asleep (general anesthetic). You may also be given a medicine to help you relax (sedative).  A breathing tube will be placed down your throat to help you breathe during the procedure.  Your abdomen will be filled with an air-like gas so it expands. This will give the surgeon more room to operate and will make your organs easier to see.  Many small incisions will be made in your abdomen.  A laparoscope and other surgical instruments will be inserted into your abdomen through the incisions.  A tissue sample may be removed from an organ for examination (biopsy). This will depend on the reason why you are having this procedure.  The laparoscope and other instruments will be removed from your abdomen.  The gas will be released.  Your incisions will be closed with stitches (sutures) and covered with a bandage (dressing).  Your breathing tube will be removed. The procedure may vary among health care providers and hospitals. What happens after the procedure?   Your blood pressure, heart rate, breathing rate, and blood oxygen level will be monitored until the medicines you were given have worn off.  Do not drive for 24 hours if you were given a sedative during your procedure.  It is up to you to get the results of your procedure. Ask your health care provider, or the department that is doing the procedure, when your results will be ready. Summary  Diagnostic  laparoscopy is a way to look for problems in the abdomen using small incisions.  Follow instructions from your health care provider about how to prepare for the procedure.  Plan to have a responsible adult care for you for at least 24 hours after you leave the hospital or clinic. This is important. This information is not intended to replace advice given to you by your health care provider. Make sure you discuss any questions you have with your health care provider. Document Revised: 12/14/2016 Document Reviewed: 06/27/2016 Elsevier Patient Education  [place].

## 2019-06-30 ENCOUNTER — Encounter: Payer: Self-pay | Admitting: Obstetrics and Gynecology

## 2019-09-16 HISTORY — PX: EMBOLIZATION: SHX1496

## 2019-11-02 ENCOUNTER — Telehealth: Payer: Self-pay

## 2019-11-02 NOTE — Telephone Encounter (Signed)
Pt calling triage today. Was a pt of Colleen. Needing to follow up with CS. She is aware that she is out of office this week. But wanted me to leave her a message to call her back when she returns to office. Would not leave details about what its about

## 2020-04-15 IMAGING — MG DIGITAL DIAGNOSTIC BILAT W/ TOMO W/ CAD
6 of 12 series · 6 of 36 positions shown · non-contrast
Comparison: Previous exam(s).

ACR Breast Density Category a: The breast tissue is almost entirely
fatty.

CLINICAL DATA: 38-year-old female presenting for evaluation of a
palpable lump in the medial right breast. The patient has history of
bilateral reduction mammoplasty, and this palpable area is along one
of her surgical scars.The patient states that there has been no
fluid/material extruding from the area, and has not had any pain or
other infectious symptoms.

EXAM:
DIGITAL DIAGNOSTIC BILATERAL MAMMOGRAM WITH CAD AND TOMO
ULTRASOUND RIGHT BREAST

[L CV synth-2D]
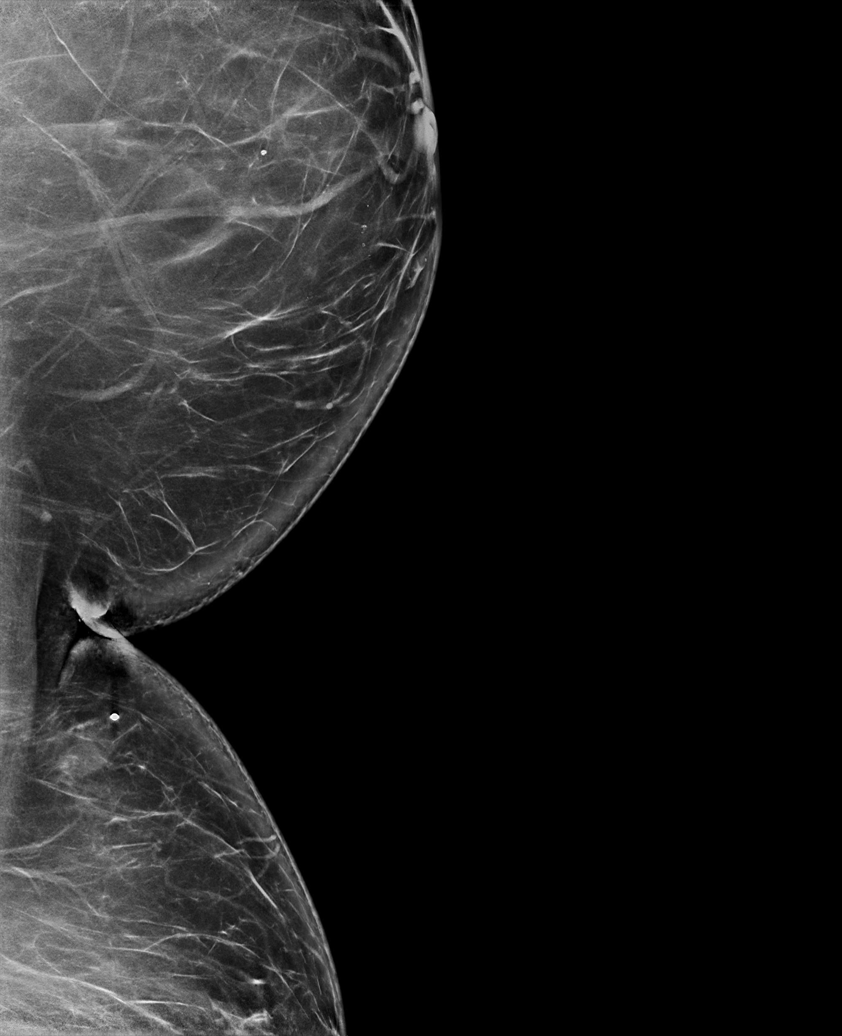

[L MLO synth-2D]
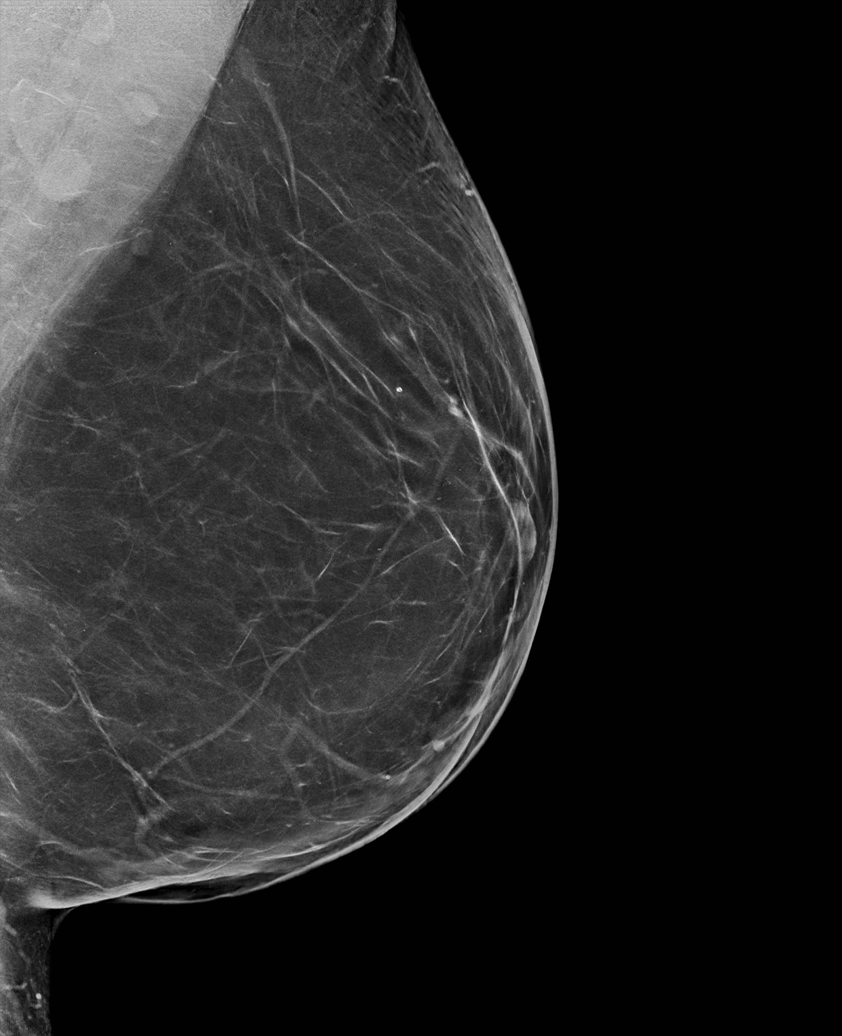

[R TAN synth-2D]
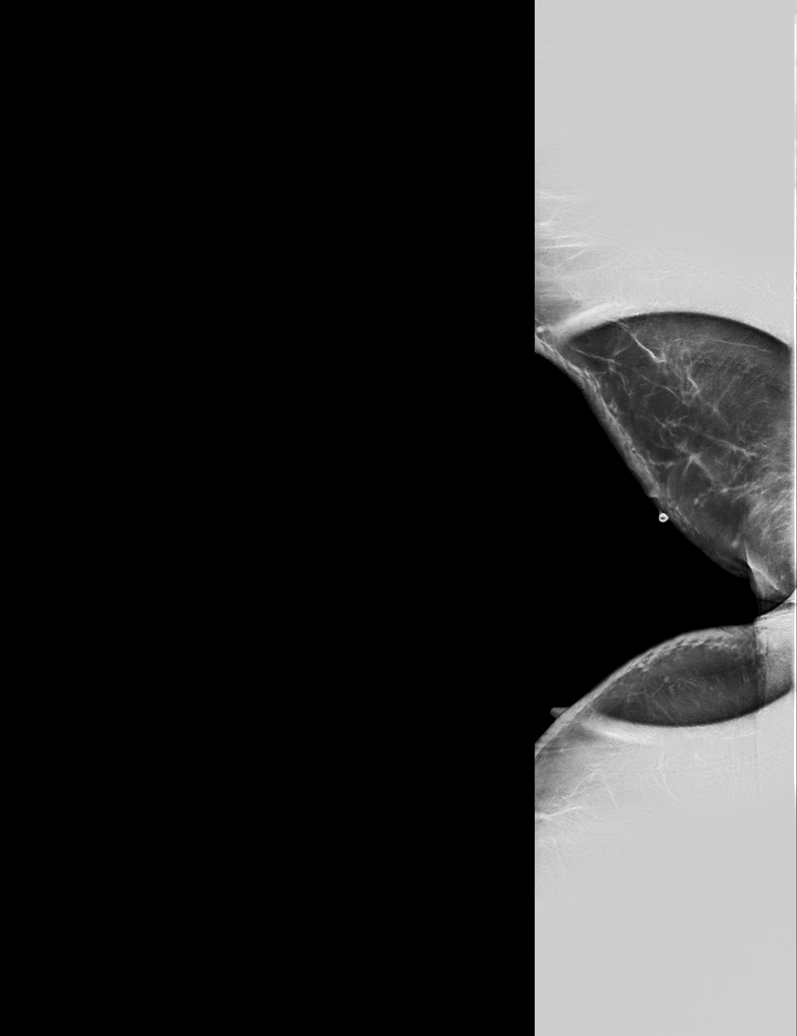

[R MLO synth-2D]
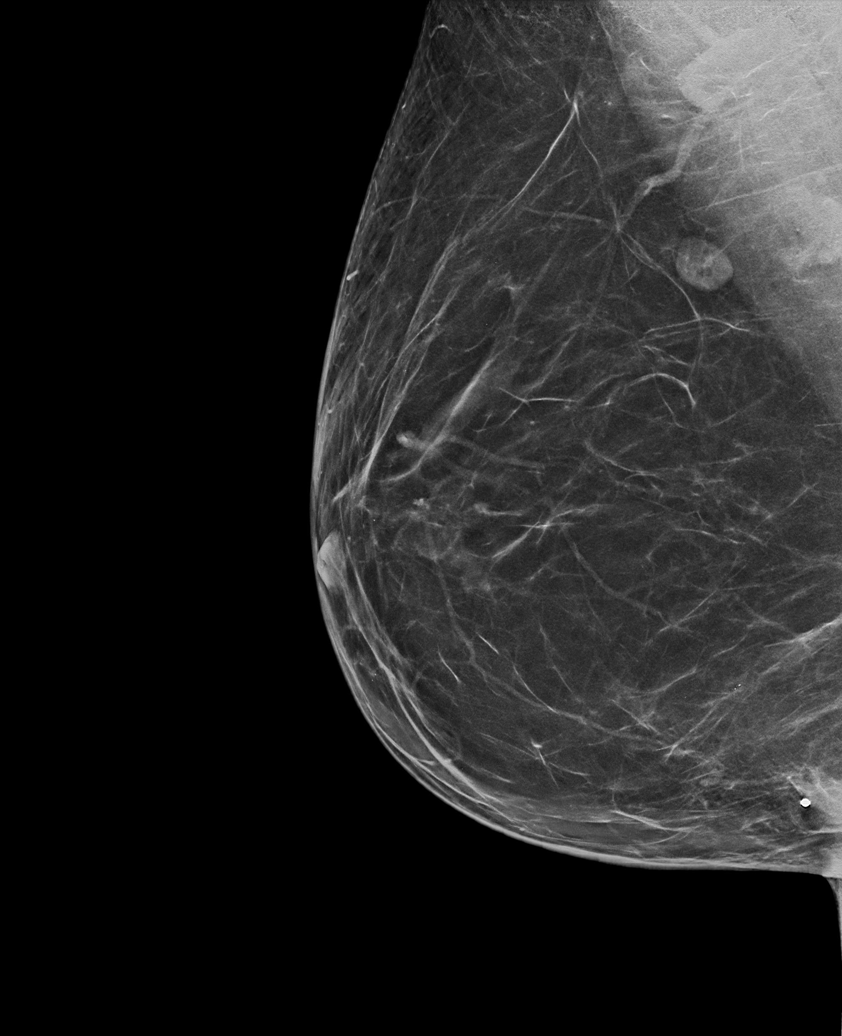

[R CC synth-2D]
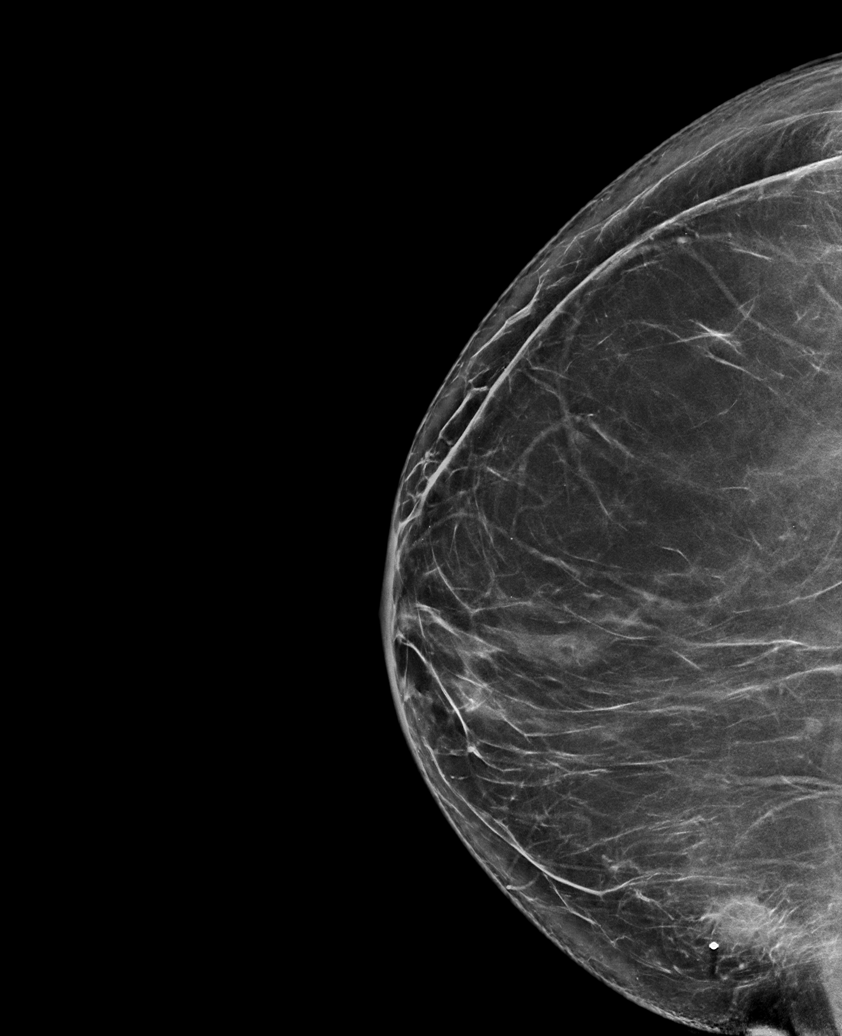

[L CC synth-2D]
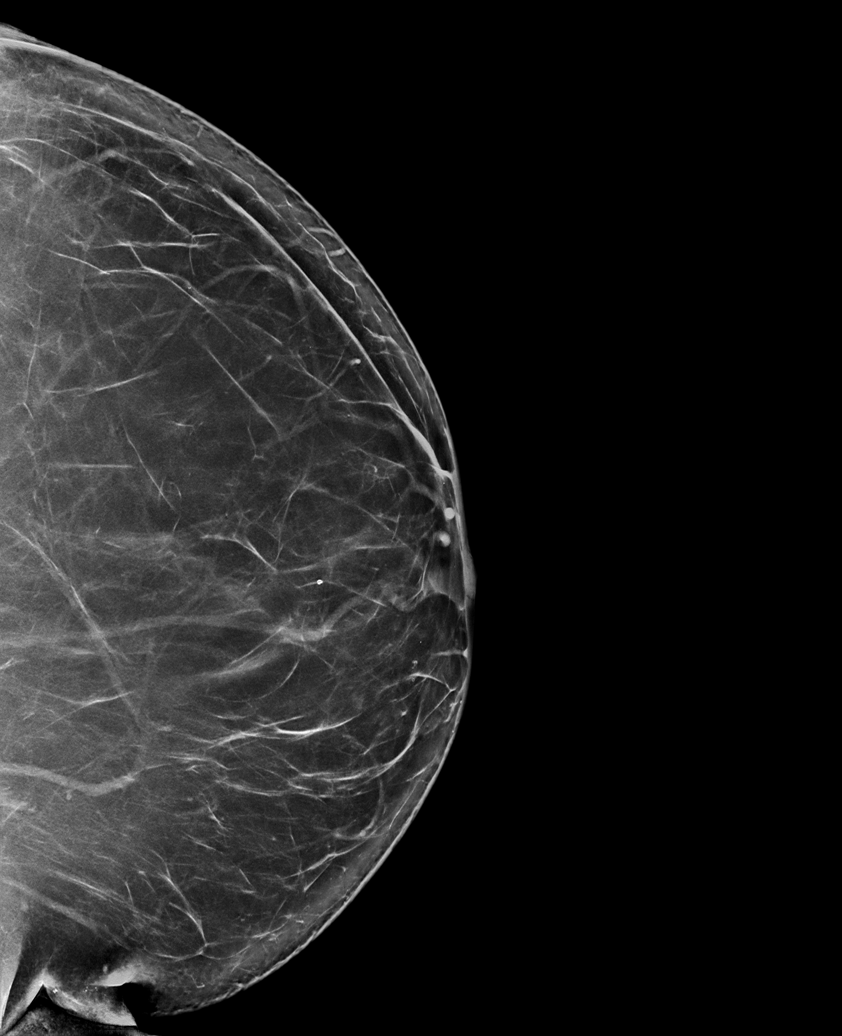

[6 of 36 positions shown; findings below may reference images not displayed]

FINDINGS: Spot compression tomosynthesis images through the palpable area of
concern which has been marked by a BB on the skin, demonstrates an
irregular superficial focal asymmetry, which may be arising from the
skin. No other suspicious calcifications, masses or areas of
distortion are seen in the bilateral breasts.

Mammographic images were processed with CAD.

There is a superficial palpable lump along the medial aspect of her
surgical scar in the medial right breast.

Ultrasound of the palpable site demonstrates a superficial
hypoechoic fluid collection arising from within the skin measuring
2.8 x 0.8 x 1.3 cm. No suspicious findings are seen in the right
axilla.
IMPRESSION: 1. There is a superficial fluid collection at the palpable site
representing either a benign sebaceous cyst or epidermal inclusion
cyst.

2.  No mammographic evidence of malignancy in the bilateral breasts.

RECOMMENDATION:
1. Clinical management for benign sebaceous cyst or possible
epidermal inclusion cyst. Image guided percutaneous aspiration of
such of fluid collection is not recommended, as this can cause a
significant inflammatory response. If necessary, surgical
consultation would be recommended for incision and drainage.

2.  Screening mammogram in one year.(Code:[id number]-9-PYD)

I have discussed the findings and recommendations with the patient.
If applicable, a reminder letter will be sent to the patient
regarding the next appointment.

BI-RADS CATEGORY  2: Benign.

## 2020-05-09 ENCOUNTER — Other Ambulatory Visit: Payer: Self-pay | Admitting: Obstetrics and Gynecology

## 2020-06-02 ENCOUNTER — Telehealth: Payer: Self-pay

## 2020-06-02 ENCOUNTER — Other Ambulatory Visit: Payer: Self-pay | Admitting: Obstetrics and Gynecology

## 2020-06-02 MED ORDER — FOLIC ACID 1 MG PO TABS: 1.0000 mg | ORAL_TABLET | Freq: Every day | ORAL | 0 refills | Status: DC

## 2020-06-02 NOTE — Telephone Encounter (Signed)
Rx eRxd. Pt due for annual, pls sched

## 2020-06-02 NOTE — Progress Notes (Signed)
Rx RF folic acid. Pt due for annual

## 2020-06-02 NOTE — Telephone Encounter (Signed)
Pt states that she has been trying to get a refill on her Folic acid for a while, the med was prescribe by Dalia Heading and she knows she's no longer here but she would love to have more of the Folic Acid if its possible.

## 2020-06-02 NOTE — Telephone Encounter (Signed)
Pt aware. Annual is schedule for 5/31.

## 2020-06-13 ENCOUNTER — Encounter: Payer: Self-pay | Admitting: Obstetrics and Gynecology

## 2020-06-13 NOTE — Progress Notes (Signed)
PCP:  Erline Levine, MD   Chief Complaint  Patient presents with  . Gynecologic Exam    No concerns     HPI:      Melissa Schroeder is a 40 y.o. G3P0030 whose LMP was Patient's last menstrual period was 06/10/2020 (exact date)., presents today for her annual examination.  Her menses are monthly, lasting 7 days, mod flow, no BTB. No dysmen. Has IDA. Did depo in past for cycles but stopped 9/19 to conceive. Hasn't conceived yet. Had positive serum prog 5/21. Partner without recent children (youngest is 12), no semen analysis done. Had GYN u/s 5/21 that showed "EM=possible between 7.4 to 13.2 mm. A submucosal fibroid or polyp can not be ruled out do to limited visualization." Was supposed to have hysteroscopy and HSG with Dr. Gilman Schmidt but was never scheduled. Not taking PNVs but is taking Rx folic acid 1 g. Needs Rx RF.   Hx of first trimester loss in 2007 and then a second trimester loss at 19 weeks when she presented with painless dilation and chorioamnionitis. Hx of TAB at age 89 She has a history of endometriosis and was treated in the past with Lupron, Depo-Provera, and extended cycle OCP. Diagnosed with gallstones and liver lesions and can no longer have estrogen BC. Had 2 Mirenas in the past but they both "fell out".   She has a hx of thyromegaly with nodules. She has had neg labs, bx and repeat u/s. Followed by endocrine at [facility], last appt 4/22.  Hx of hepatic adenomas; treated with bland embolization 9/21 at [facility].   Sex activity: single partner, contraception - none. Wants to conceive. Not taking PNVs.  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  There is a FH of breast cancer in her pat grt aunt, genetic testing not indicated. There is no FH of ovarian cancer. The patient does not do self-breast exams.  Tobacco use: The patient denies current or  previous tobacco use. Alcohol use: social No drug use.  Exercise: occas active  She does get adequate calcium and Vitamin D in her diet.  Labs with PCP. Also sees endocrine for DM; they recently started her on ozempic for better blood sugar control and conception not recommended on it. Pt thinks they're using it short term and will then have her come off it.  Past Medical History:  Diagnosis Date  . Cellulitis 11/2016   lower abdomen  . Cervical dysplasia    CIN II  . Diabetes mellitus without complication ([place])   . Dysmenorrhea   . Endometriosis   . GERD (gastroesophageal reflux disease)   . Hepatic adenoma   . Hiatal hernia   . History of abnormal cervical Pap smear   . History of loop electrical excision procedure (LEEP) 03/2005   CIN 2  . Proteinuria   . Thyroid nodule   . Thyromegaly     Past Surgical History:  Procedure Laterality Date  . BREAST BIOPSY    . BREAST SURGERY  06/2014   scar revision of left side  . COLONOSCOPY  02/24/2016  . COLPOSCOPY    . EMBOLIZATION  09/2019   hepatic adenomas, at [facility]   . ESOPHAGOGASTRODUODENOSCOPY ENDOSCOPY    . LAPAROSCOPY    . LEEP    . REDUCTION MAMMAPLASTY    . reduction mammoplasty Bilateral   . RHINOPLASTY  deviated septum  . sweat glands removed under left arm      Family History  Problem Relation Age of Onset  . Hypertension Mother   . Prostate cancer Paternal Uncle   . Brain cancer Paternal Grandfather 39  . Breast cancer Other 50  . Colon cancer Other     Social History   Socioeconomic History  . Marital status: Single    Spouse name: Not on file  . Number of children: Not on file  . Years of education: Not on file  . Highest education level: Not on file  Occupational History  . Not on file  Tobacco Use  . Smoking status: Never Smoker  . Smokeless tobacco: Never Used  Vaping Use  . Vaping Use: Never used  Substance and Sexual Activity  . Alcohol use: No  . Drug use: No  . Sexual  activity: Yes    Birth control/protection: None  Other Topics Concern  . Not on file  Social History Narrative  . Not on file   Social Determinants of Health   Financial Resource Strain: Not on file  Food Insecurity: Not on file  Transportation Needs: Not on file  Physical Activity: Not on file  Stress: Not on file  Social Connections: Not on file  Intimate Partner Violence: Not on file    Current Meds  Medication Sig  . albuterol (VENTOLIN HFA) 108 (90 Base) MCG/ACT inhaler Inhale into the lungs.  . cetirizine (ZYRTEC) 10 MG tablet TAKE 1 TABLET BY MOUTH EVERY DAY (NOT COVER)  . Docusate Sodium (DSS) 100 MG CAPS Take by mouth.  . ferrous sulfate 325 (65 FE) MG tablet Take 1 tablet by mouth daily.  Marland Kitchen glipiZIDE (GLUCOTROL XL) 10 MG 24 hr tablet Take 10 mg by mouth.  . ONE TOUCH ULTRA TEST test strip TEST BLOOD SUGARS TID. PLEASE DISPENSE PER INSURANCE COVERAGE. ICD-10 E11.9  . ONETOUCH DELICA LANCETS 11H MISC 1 EACH BY OTHER ROUTE THREE (3) TIMES A DAY.  Marland Kitchen OZEMPIC, 0.25 OR 0.5 MG/DOSE, 2 MG/1.5ML SOPN Inject into the skin.  Marland Kitchen traZODone (DESYREL) 50 MG tablet Take 50 mg by mouth at bedtime.  Marland Kitchen venlafaxine XR (EFFEXOR-XR) 150 MG 24 hr capsule Take 150 mg by mouth daily.  . [DISCONTINUED] folic acid (FOLVITE) 1 MG tablet Take 1 tablet (1 mg total) by mouth daily.     ROS:  Review of Systems  Constitutional: Negative for fatigue, fever and unexpected weight change.  Respiratory: Negative for cough, shortness of breath and wheezing.   Cardiovascular: Negative for chest pain, palpitations and leg swelling.  Gastrointestinal: Negative for blood in stool, constipation, diarrhea, nausea and vomiting.  Endocrine: Negative for cold intolerance, heat intolerance and polyuria.  Genitourinary: Negative for dyspareunia, dysuria, flank pain, frequency, genital sores, hematuria, menstrual problem, pelvic pain, urgency, vaginal bleeding, vaginal discharge and vaginal pain.  Musculoskeletal:  Negative for back pain, joint swelling and myalgias.  Skin: Negative for rash.  Neurological: Negative for dizziness, syncope, light-headedness, numbness and headaches.  Hematological: Negative for adenopathy.  Psychiatric/Behavioral: Negative for agitation, confusion, sleep disturbance and suicidal ideas. The patient is not nervous/anxious.      Objective: BP 110/80   Ht 5\' 3"  (1.6 m)   Wt 252 lb (114.3 kg)   LMP 06/10/2020 (Exact Date)   BMI 44.64 kg/m    Physical Exam Constitutional:      Appearance: She is well-developed.  Genitourinary:     Vulva normal.     Right Labia: No  rash, tenderness or lesions.    Left Labia: No tenderness, lesions or rash.    Vaginal bleeding present.     No vaginal discharge, erythema or tenderness.      Right Adnexa: not tender and no mass present.    Left Adnexa: not tender and no mass present.    No cervical motion tenderness, friability or polyp.     Uterus is not enlarged or tender.  Breasts:     Right: No mass, nipple discharge, skin change or tenderness.     Left: No mass, nipple discharge, skin change or tenderness.    Neck:     Thyroid: Thyromegaly present.      Comments: LT LOBE LARGER THAN RT LOBE  Cardiovascular:     Rate and Rhythm: Normal rate and regular rhythm.     Heart sounds: Normal heart sounds. No murmur heard.   Pulmonary:     Effort: Pulmonary effort is normal.     Breath sounds: Normal breath sounds.  Abdominal:     Palpations: Abdomen is soft.     Tenderness: There is no abdominal tenderness. There is no guarding or rebound.  Musculoskeletal:        General: Normal range of motion.     Cervical back: Normal range of motion.  Lymphadenopathy:     Cervical: No cervical adenopathy.  Neurological:     General: No focal deficit present.     Mental Status: She is alert and oriented to person, place, and time.     Cranial Nerves: No cranial nerve deficit.  Skin:    General: Skin is warm and dry.   Psychiatric:        Mood and Affect: Mood normal.        Behavior: Behavior normal.        Thought Content: Thought content normal.        Judgment: Judgment normal.  Vitals reviewed.     Assessment/Plan: Encounter for annual routine gynecological examination  Cervical cancer screening - Plan: IGP, rfx Aptima HPV ASCU  History of cervical dysplasia - Plan: IGP, rfx Aptima HPV ASCU; repeat pap today. Will f/u if abn.   Encounter for screening mammogram for malignant neoplasm of breast - Plan: MM 3D SCREEN BREAST BILATERAL; pt to sched mammo  Infertility counseling--pt with confirmed ovulation 5/21 on labs, check semen analysis of partner. Lab order placed. Check GYN u/s again to see status of EM cavity. Will then discuss with Dr. Gilman Schmidt for f/u. Rx RF folic acid 1 g.   Partner info: Kathryne Gin. [place] 11/24/1967 MRN: [id number]  Leiomyoma - Plan: US PELVIC COMPLETE WITH TRANSVAGINAL  Meds ordered this encounter  Medications  . folic acid (FOLVITE) 1 MG tablet    Sig: Take 1 tablet (1 mg total) by mouth daily.    Dispense:  90 tablet    Refill:  3    Order Specific Question:   Supervising Provider    Answer:   Gae Dry [[id number]]       GYN counsel adequate intake of calcium and vitamin D, diet and exercise     F/U  Return in about 1 year (around 06/14/2021).  Sheryl Towell B. Abbygayle Helfand, PA-C 06/14/2020 2:46 PM

## 2020-06-14 ENCOUNTER — Ambulatory Visit (INDEPENDENT_AMBULATORY_CARE_PROVIDER_SITE_OTHER): Payer: BC Managed Care – PPO | Admitting: Obstetrics and Gynecology

## 2020-06-14 ENCOUNTER — Other Ambulatory Visit: Payer: Self-pay

## 2020-06-14 ENCOUNTER — Encounter: Payer: Self-pay | Admitting: Obstetrics and Gynecology

## 2020-06-14 VITALS — BP 110/80 | Ht 63.0 in | Wt 252.0 lb

## 2020-06-14 DIAGNOSIS — Z87410 Personal history of cervical dysplasia: Secondary | ICD-10-CM | POA: Diagnosis not present

## 2020-06-14 DIAGNOSIS — Z1231 Encounter for screening mammogram for malignant neoplasm of breast: Secondary | ICD-10-CM

## 2020-06-14 DIAGNOSIS — Z124 Encounter for screening for malignant neoplasm of cervix: Secondary | ICD-10-CM | POA: Diagnosis not present

## 2020-06-14 DIAGNOSIS — D219 Benign neoplasm of connective and other soft tissue, unspecified: Secondary | ICD-10-CM

## 2020-06-14 DIAGNOSIS — Z01419 Encounter for gynecological examination (general) (routine) without abnormal findings: Secondary | ICD-10-CM

## 2020-06-14 DIAGNOSIS — Z3169 Encounter for other general counseling and advice on procreation: Secondary | ICD-10-CM

## 2020-06-14 MED ORDER — FOLIC ACID 1 MG PO TABS: 1.0000 mg | ORAL_TABLET | Freq: Every day | ORAL | 3 refills | Status: DC

## 2020-06-14 NOTE — Patient Instructions (Addendum)
I value your feedback and you entrusting us with your care. If you get a [facility] patient survey, I would appreciate you taking the time to let us know about your experience today. Thank you!  [facility]: [phone]      

## 2020-06-21 LAB — IGP, RFX APTIMA HPV ASCU

## 2020-06-30 ENCOUNTER — Encounter: Payer: Self-pay | Admitting: Obstetrics and Gynecology

## 2020-07-02 ENCOUNTER — Other Ambulatory Visit: Payer: Self-pay | Admitting: Obstetrics and Gynecology

## 2020-07-02 MED ORDER — NORETHINDRONE 0.35 MG PO TABS: 1.0000 | ORAL_TABLET | Freq: Every day | ORAL | 3 refills | Status: AC

## 2020-07-12 ENCOUNTER — Ambulatory Visit: Payer: BLUE CROSS/BLUE SHIELD

## 2020-08-01 ENCOUNTER — Other Ambulatory Visit: Payer: BLUE CROSS/BLUE SHIELD

## 2020-08-08 ENCOUNTER — Other Ambulatory Visit: Payer: Self-pay

## 2020-08-08 ENCOUNTER — Ambulatory Visit
Admission: RE | Admit: 2020-08-08 | Discharge: 2020-08-08 | Disposition: A | Discharge status: Home / Self Care | Payer: BC Managed Care – PPO | Source: Ambulatory Visit | Attending: Obstetrics and Gynecology | Admitting: Obstetrics and Gynecology

## 2020-08-08 DIAGNOSIS — D219 Benign neoplasm of connective and other soft tissue, unspecified: Secondary | ICD-10-CM | POA: Diagnosis not present

## 2020-08-10 ENCOUNTER — Telehealth: Payer: Self-pay | Admitting: Obstetrics and Gynecology

## 2020-08-10 NOTE — Telephone Encounter (Signed)
Pt aware of normal GYN u/s. Nothing unusual in endometrium (f/u from prior u/s).  Partner hasn't done semen analysis yet. Pt had confirmed ovulation on labs in 2021.  Pt on ozempic and was told not to get pregnant now, so on POPs currently.  When ready to come off POPs and ozempic, partner with do semen analysis and then pt to do HSG if that is normal. Pt understands.  F/u prn.

## 2020-11-07 ENCOUNTER — Other Ambulatory Visit: Payer: Self-pay | Admitting: Obstetrics and Gynecology

## 2020-11-07 ENCOUNTER — Encounter: Payer: Self-pay | Admitting: Obstetrics and Gynecology

## 2020-11-07 MED ORDER — IBUPROFEN 800 MG PO TABS: 800.0000 mg | ORAL_TABLET | Freq: Three times a day (TID) | ORAL | 1 refills | Status: DC | PRN

## 2020-11-07 NOTE — Progress Notes (Signed)
Rx ibup prn dysmen

## 2020-11-16 ENCOUNTER — Other Ambulatory Visit: Payer: Self-pay

## 2020-11-16 ENCOUNTER — Encounter: Payer: Self-pay | Admitting: Obstetrics and Gynecology

## 2020-11-16 ENCOUNTER — Other Ambulatory Visit (HOSPITAL_COMMUNITY)
Admission: RE | Admit: 2020-11-16 | Discharge: 2020-11-16 | Disposition: A | Discharge status: Home / Self Care | Payer: BC Managed Care – PPO | Source: Ambulatory Visit | Attending: Obstetrics and Gynecology | Admitting: Obstetrics and Gynecology

## 2020-11-16 ENCOUNTER — Ambulatory Visit (INDEPENDENT_AMBULATORY_CARE_PROVIDER_SITE_OTHER): Payer: BC Managed Care – PPO | Admitting: Obstetrics and Gynecology

## 2020-11-16 VITALS — BP 126/70 | Ht 63.0 in | Wt 228.0 lb

## 2020-11-16 DIAGNOSIS — R87615 Unsatisfactory cytologic smear of cervix: Secondary | ICD-10-CM | POA: Diagnosis present

## 2020-11-16 DIAGNOSIS — N871 Moderate cervical dysplasia: Secondary | ICD-10-CM

## 2020-11-16 DIAGNOSIS — B9689 Other specified bacterial agents as the cause of diseases classified elsewhere: Secondary | ICD-10-CM | POA: Diagnosis not present

## 2020-11-16 DIAGNOSIS — Z113 Encounter for screening for infections with a predominantly sexual mode of transmission: Secondary | ICD-10-CM | POA: Insufficient documentation

## 2020-11-16 DIAGNOSIS — N760 Acute vaginitis: Secondary | ICD-10-CM

## 2020-11-16 DIAGNOSIS — Z124 Encounter for screening for malignant neoplasm of cervix: Secondary | ICD-10-CM | POA: Insufficient documentation

## 2020-11-16 LAB — POCT WET PREP WITH KOH
Clue Cells Wet Prep HPF POC: POSITIVE
KOH Prep POC: POSITIVE — AB
Trichomonas, UA: NEGATIVE
Yeast Wet Prep HPF POC: NEGATIVE

## 2020-11-16 MED ORDER — METRONIDAZOLE 0.75 % VA GEL: 1.0000 | Freq: Every day | VAGINAL | 0 refills | Status: DC

## 2020-11-16 MED ORDER — METRONIDAZOLE 500 MG PO TABS: 500.0000 mg | ORAL_TABLET | Freq: Two times a day (BID) | ORAL | 0 refills | Status: AC

## 2020-11-16 NOTE — Progress Notes (Signed)
Kimel-Scott, Santiago Glad, MD   Chief Complaint  Patient presents with   STD testing    HPI:      [name] is a 40 y.o. G3P0030 whose LMP was Patient's last menstrual period was 10/31/2020 (approximate)., presents today for STD testing. Has been having increased vag d/c, no odor/itching for sev days. D/C unusual for pt. No known exposures/other sx. No hx of BV. No urin sx, no LBP, pelvic pain, fevers. No recent hx of STDs.  Pt also with unsatisfactory pap 5/22 due to bleeding. Hx of CIN 2 and while pap not indicated per se, will repeat today due to hx.    Past Medical History:  Diagnosis Date   Cellulitis 11/2016   lower abdomen   Cervical dysplasia    CIN II   Diabetes mellitus without complication (HCC)    Dysmenorrhea    Endometriosis    GERD (gastroesophageal reflux disease)    Hepatic adenoma    Hiatal hernia    History of abnormal cervical Pap smear    History of loop electrical excision procedure (LEEP) 03/2005   CIN 2   Proteinuria    Thyroid nodule    Thyromegaly     Past Surgical History:  Procedure Laterality Date   BREAST BIOPSY     BREAST SURGERY  06/2014   scar revision of left side   COLONOSCOPY  02/24/2016   COLPOSCOPY     EMBOLIZATION  09/2019   hepatic adenomas, at [facility]    ESOPHAGOGASTRODUODENOSCOPY ENDOSCOPY     LAPAROSCOPY     LEEP     REDUCTION MAMMAPLASTY     reduction mammoplasty Bilateral    RHINOPLASTY     deviated septum   sweat glands removed under left arm      Family History  Problem Relation Age of Onset   Hypertension Mother    Prostate cancer Paternal Uncle    Brain cancer Paternal Grandfather 76   Breast cancer Other 90   Colon cancer Other     Social History   Socioeconomic History   Marital status: Single    Spouse name: Not on file   Number of children: Not on file   Years of education: Not on file   Highest education level: Not on file  Occupational History   Not on file  Tobacco Use   Smoking  status: Never   Smokeless tobacco: Never  Vaping Use   Vaping Use: Never used  Substance and Sexual Activity   Alcohol use: No   Drug use: No   Sexual activity: Yes    Birth control/protection: Pill  Other Topics Concern   Not on file  Social History Narrative   Not on file   Social Determinants of Health   Financial Resource Strain: Not on file  Food Insecurity: Not on file  Transportation Needs: Not on file  Physical Activity: Not on file  Stress: Not on file  Social Connections: Not on file  Intimate Partner Violence: Not on file    Outpatient Medications Prior to Visit  Medication Sig Dispense Refill   cetirizine (ZYRTEC) 10 MG tablet TAKE 1 TABLET BY MOUTH EVERY DAY (NOT COVER)     Docusate Sodium (DSS) 100 MG CAPS Take by mouth.     ferrous sulfate 325 (65 FE) MG tablet Take 1 tablet by mouth daily.     folic acid (FOLVITE) 1 MG tablet Take 1 tablet (1 mg total) by mouth daily. 90 tablet 3  glipiZIDE (GLUCOTROL XL) 10 MG 24 hr tablet Take 10 mg by mouth.     ibuprofen (ADVIL) 800 MG tablet Take 1 tablet (800 mg total) by mouth every 8 (eight) hours as needed. 30 tablet 1   norethindrone (MICRONOR) 0.35 MG tablet Take 1 tablet (0.35 mg total) by mouth daily. 84 tablet 3   ONE TOUCH ULTRA TEST test strip TEST BLOOD SUGARS TID. PLEASE DISPENSE PER INSURANCE COVERAGE. ICD-10 E11.9  9   ONETOUCH DELICA LANCETS 53G MISC 1 EACH BY OTHER ROUTE THREE (3) TIMES A DAY.  99   OZEMPIC, 0.25 OR 0.5 MG/DOSE, 2 MG/1.5ML SOPN Inject into the skin.     traZODone (DESYREL) 50 MG tablet Take 50 mg by mouth at bedtime.     venlafaxine XR (EFFEXOR-XR) 150 MG 24 hr capsule Take 150 mg by mouth daily.     albuterol (VENTOLIN HFA) 108 (90 Base) MCG/ACT inhaler Inhale into the lungs.     pioglitazone (ACTOS) 30 MG tablet Take by mouth.     No facility-administered medications prior to visit.      ROS:  Review of Systems  Constitutional:  Negative for fever.  Gastrointestinal:   Negative for blood in stool, constipation, diarrhea, nausea and vomiting.  Genitourinary:  Positive for vaginal discharge. Negative for dyspareunia, dysuria, flank pain, frequency, hematuria, urgency, vaginal bleeding and vaginal pain.  Musculoskeletal:  Negative for back pain.  Skin:  Negative for rash.  BREAST: No symptoms   OBJECTIVE:   Vitals:  BP 126/70   Ht 5\' 3"  (1.6 m)   Wt 228 lb (103.4 kg)   LMP 10/31/2020 (Approximate)   BMI 40.39 kg/m   Physical Exam Vitals reviewed.  Constitutional:      Appearance: She is well-developed.  Pulmonary:     Effort: Pulmonary effort is normal.  Genitourinary:    General: Normal vulva.     Pubic Area: No rash.      Labia:        Right: No rash, tenderness or lesion.        Left: No rash, tenderness or lesion.      Vagina: Vaginal discharge present. No erythema or tenderness.     Cervix: Normal.     Uterus: Normal. Not enlarged and not tender.      Adnexa: Right adnexa normal and left adnexa normal.       Right: No mass or tenderness.         Left: No mass or tenderness.    Musculoskeletal:        General: Normal range of motion.     Cervical back: Normal range of motion.  Skin:    General: Skin is warm and dry.  Neurological:     General: No focal deficit present.     Mental Status: She is alert and oriented to person, place, and time.  Psychiatric:        Mood and Affect: Mood normal.        Behavior: Behavior normal.        Thought Content: Thought content normal.        Judgment: Judgment normal.    Results: Results for orders placed or performed in visit on 11/16/20 (from the past 24 hour(s))  POCT Wet Prep with KOH     Status: Abnormal   Collection Time: 11/16/20  9:27 AM  Result Value Ref Range   Trichomonas, UA Negative    Clue Cells Wet Prep HPF POC pos  Epithelial Wet Prep HPF POC     Yeast Wet Prep HPF POC neg    Bacteria Wet Prep HPF POC     RBC Wet Prep HPF POC     WBC Wet Prep HPF POC     KOH Prep  POC Positive (A) Negative     Assessment/Plan: BV (bacterial vaginosis) - Plan: metroNIDAZOLE (METROGEL) 0.75 % vaginal gel, POCT Wet Prep with KOH; pos sx and wet prep. Rx metrogel. F/u prn.   Screening for STD (sexually transmitted disease) - Plan: Cervicovaginal ancillary only, HIV Antibody (routine testing w rflx), RPR, HSV 2 antibody, IgG, Hepatitis C antibody  Cervical cancer screening - Plan: Cytology - PAP; repeat today.   Unsatisfactory cervical cytology smear - Plan: Cytology - PAP  Dysplasia of cervix, high grade CIN 2 - Plan: Cytology - PAP    Meds ordered this encounter  Medications   metroNIDAZOLE (METROGEL) 0.75 % vaginal gel    Sig: Place 1 Applicatorful vaginally at bedtime for 5 days.    Dispense:  50 g    Refill:  0    Order Specific Question:   Supervising Provider    Answer:   Gae Dry [[id number]]       Return if symptoms worsen or fail to improve.  Jonty Morrical B. Towana Stenglein, PA-C 11/16/2020 9:29 AM

## 2020-11-17 LAB — CERVICOVAGINAL ANCILLARY ONLY
Chlamydia: NEGATIVE
Comment: NEGATIVE
Comment: NEGATIVE
Comment: NORMAL
Neisseria Gonorrhea: NEGATIVE
Trichomonas: NEGATIVE

## 2020-11-17 LAB — HIV ANTIBODY (ROUTINE TESTING W REFLEX): HIV Screen 4th Generation wRfx: NONREACTIVE

## 2020-11-17 LAB — RPR: RPR Ser Ql: NONREACTIVE

## 2020-11-17 LAB — HEPATITIS C ANTIBODY: Hep C Virus Ab: <0.1 s/co ratio (ref 0.0–0.9)

## 2020-11-17 LAB — HSV 2 ANTIBODY, IGG: HSV 2 IgG, Type Spec: <0.91 index (ref 0.00–0.90)

## 2020-11-18 LAB — CYTOLOGY - PAP
Adequacy: ABSENT
Diagnosis: NEGATIVE

## 2020-11-23 ENCOUNTER — Other Ambulatory Visit: Payer: Self-pay | Admitting: Obstetrics and Gynecology

## 2020-11-23 ENCOUNTER — Ambulatory Visit: Payer: BC Managed Care – PPO | Admitting: Obstetrics and Gynecology

## 2020-11-23 DIAGNOSIS — B3731 Acute candidiasis of vulva and vagina: Secondary | ICD-10-CM

## 2020-11-23 MED ORDER — FLUCONAZOLE 150 MG PO TABS: 150.0000 mg | ORAL_TABLET | Freq: Once | ORAL | 0 refills | Status: AC

## 2020-11-23 NOTE — Progress Notes (Signed)
Rx diflucan for yeast vag sx after flagyl tx for BV

## 2020-11-30 ENCOUNTER — Ambulatory Visit: Payer: BC Managed Care – PPO | Admitting: Advanced Practice Midwife

## 2020-12-13 ENCOUNTER — Other Ambulatory Visit: Payer: Self-pay | Admitting: Obstetrics and Gynecology

## 2020-12-15 ENCOUNTER — Other Ambulatory Visit: Payer: Self-pay | Admitting: Obstetrics and Gynecology

## 2021-05-08 ENCOUNTER — Ambulatory Visit
Admission: RE | Admit: 2021-05-08 | Discharge: 2021-05-08 | Disposition: A | Discharge status: Home / Self Care | Payer: BC Managed Care – PPO | Source: Ambulatory Visit | Attending: Family Medicine | Admitting: Family Medicine

## 2021-05-08 VITALS — BP 134/89 | HR 77 | Temp 99.4°F | Resp 16

## 2021-05-08 DIAGNOSIS — N760 Acute vaginitis: Secondary | ICD-10-CM | POA: Diagnosis not present

## 2021-05-08 MED ORDER — NYSTATIN 100000 UNIT/GM EX CREA: 1.0000 "application " | TOPICAL_CREAM | Freq: Two times a day (BID) | CUTANEOUS | 0 refills | Status: AC

## 2021-05-08 MED ORDER — FLUCONAZOLE 150 MG PO TABS: 150.0000 mg | ORAL_TABLET | ORAL | 0 refills | Status: AC | PRN

## 2021-05-08 NOTE — ED Provider Notes (Signed)
?[facility] ? ? ? ?CSN: [id number] ?Arrival date & time: 05/08/21  1741 ? ? ?  ? ?History   ?Chief Complaint ?Chief Complaint  ?Patient presents with  ? Vaginal Itching  ? ? ?HPI ?Melissa Schroeder is a 41 y.o. female.  ? ?HPI ?Patient with a history of diabetes, BV, yeast presents today with vaginal irritation and itching x2 days.  Patient has been using a skin lightening product on her inner thighs and is concerned that this may have caused some irritation to her vaginal area.  She has a history of yeast.  She has low concern for possible STI, however is acceptable for being screened to for STI. She reports the pain is more inside of the vaginal orifice and pain is exacerbated by wiping. No dysuria. She has not tried any OTC medication. Endorse abnormal vaginal discharge which she described as "cloudy".  ? ?Past Medical History:  ?Diagnosis Date  ? Cellulitis 11/2016  ? lower abdomen  ? Cervical dysplasia   ? CIN II  ? Diabetes mellitus without complication ([place])   ? Dysmenorrhea   ? Endometriosis   ? GERD (gastroesophageal reflux disease)   ? Hepatic adenoma   ? Hiatal hernia   ? History of abnormal cervical Pap smear   ? History of loop electrical excision procedure (LEEP) 03/2005  ? CIN 2  ? Proteinuria   ? Thyroid nodule   ? Thyromegaly   ? ? ?Patient Active Problem List  ? Diagnosis Date Noted  ? History of cervical dysplasia 06/14/2020  ? Diabetes mellitus without complication ([place])   ? Hepatic adenoma   ? Thyromegaly   ? Iron deficiency anemia due to chronic blood loss 01/23/2019  ? Urge incontinence 12/18/2017  ? 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 Cellulitis and abscess of trunk 12/18/2016  ? Choledocholithiasis 10/28/2016  ? Obesity (BMI 30-39.9) 10/28/2016  ? Essential hypertension 05/30/2016  ? Endometriosis 02/23/2014  ? 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 Diabetic nephropathy ([place]) 12/28/2011  ? Hiatal hernia 06/29/2011  ? Polycystic ovarian disease 06/29/2011  ? Gastro-esophageal reflux  disease with esophagitis 04/27/2011  ? History of loop electrical excision procedure (LEEP) 03/2005  ? ? ?Past Surgical History:  ?Procedure Laterality Date  ? BREAST BIOPSY    ? BREAST SURGERY  06/2014  ? scar revision of left side  ? COLONOSCOPY  02/24/2016  ? COLPOSCOPY    ? EMBOLIZATION  09/2019  ? hepatic adenomas, at [facility]   ? ESOPHAGOGASTRODUODENOSCOPY ENDOSCOPY    ? LAPAROSCOPY    ? LEEP    ? REDUCTION MAMMAPLASTY    ? reduction mammoplasty Bilateral   ? RHINOPLASTY    ? deviated septum  ? sweat glands removed under left arm    ? ? ?OB History   ? ? Gravida  ?3  ? Para  ?   ? Term  ?   ? Preterm  ?   ? AB  ?3  ? Living  ?   ?  ? ? SAB  ?3  ? IAB  ?0  ? Ectopic  ?   ? Multiple  ?   ? Live Births  ?   ?   ?  ?  ? ? ? ?Home Medications   ? ?Prior to Admission medications   ?Medication Sig Start Date End Date Taking? Authorizing Provider  ?fluconazole (DIFLUCAN) 150 MG tablet Take 1 tablet (150 mg total) by mouth every three (3) days as needed for up to 2 days.  05/08/21 05/10/21 Yes Scot Jun, FNP  ?nystatin cream (MYCOSTATIN) Apply 1 application. topically 2 (two) times daily. 05/08/21  Yes Scot Jun, FNP  ?albuterol (VENTOLIN HFA) 108 (90 Base) MCG/ACT inhaler Inhale into the lungs. 07/10/19 07/09/20  [provider]  ?cetirizine (ZYRTEC) 10 MG tablet TAKE 1 TABLET BY MOUTH EVERY DAY (NOT COVER) 02/14/13   [provider]  ?Docusate Sodium (DSS) 100 MG CAPS Take by mouth.    [provider]  ?ferrous sulfate 325 (65 FE) MG tablet Take 1 tablet by mouth daily. 01/04/20 01/03/21  [provider]  ?folic acid (FOLVITE) 1 MG tablet Take 1 tablet (1 mg total) by mouth daily. 03/16/58   Copland, Deirdre Evener, PA-C  ?glipiZIDE (GLUCOTROL XL) 10 MG 24 hr tablet Take 10 mg by mouth. 11/16/16   [provider]  ?ibuprofen (ADVIL) 800 MG tablet Take 1 tablet (800 mg total) by mouth every 8 (eight) hours as needed. 10/93/23   Copland, Deirdre Evener, PA-C  ?norethindrone  (MICRONOR) 0.35 MG tablet Take 1 tablet (0.35 mg total) by mouth daily. 5/57/32   Copland, Elmo Putt B, PA-C  ?ONE TOUCH ULTRA TEST test strip TEST BLOOD SUGARS TID. PLEASE DISPENSE PER INSURANCE COVERAGE. ICD-10 E11.9 10/01/16   [provider]  ?Jonetta Speak LANCETS 20U MISC 1 EACH BY OTHER ROUTE THREE (3) TIMES A DAY. 11/19/16   [provider]  ?OZEMPIC, 0.25 OR 0.5 MG/DOSE, 2 MG/1.5ML SOPN Inject into the skin. 05/30/20   [provider]  ?traZODone (DESYREL) 50 MG tablet Take 50 mg by mouth at bedtime. 06/01/20   [provider]  ?venlafaxine XR (EFFEXOR-XR) 150 MG 24 hr capsule Take 150 mg by mouth daily. 03/19/20   [provider]  ? ? ?Family History ?Family History  ?Problem Relation Age of Onset  ? Hypertension Mother   ? Prostate cancer Paternal Uncle   ? Brain cancer Paternal Grandfather 83  ? Breast cancer Other 50  ? Colon cancer Other   ? ? ?Social History ?Social History  ? ?Tobacco Use  ? Smoking status: Never  ? Smokeless tobacco: Never  ?Vaping Use  ? Vaping Use: Never used  ?Substance Use Topics  ? Alcohol use: No  ? Drug use: No  ? ? ? ?Allergies   ?Sulfa antibiotics and Sulfamethoxazole-trimethoprim ? ? ?Review of Systems ?Review of Systems ?Pertinent negatives listed in HPI  ?Physical Exam ?Triage Vital Signs ?ED Triage Vitals  ?Enc Vitals Group  ?   BP 05/08/21 1825 134/89  ?   Pulse Rate 05/08/21 1825 77  ?   Resp 05/08/21 1825 16  ?   Temp 05/08/21 1825 99.4 ?F (37.4 ?C)  ?   Temp Source 05/08/21 1825 Oral  ?   SpO2 05/08/21 1825 99 %  ?   Weight --   ?   Height --   ?   Head Circumference --   ?   Peak Flow --   ?   Pain Score 05/08/21 1824 0  ?   Pain Loc --   ?   Pain Edu? --   ?   Excl. in [place]? --   ? ?No data found. ? ?Updated Vital Signs ?BP 134/89 (BP Location: Left Arm)   Pulse 77   Temp 99.4 ?F (37.4 ?C) (Oral)   Resp 16   SpO2 99%  ? ?Visual Acuity ?Right Eye Distance:   ?Left Eye Distance:   ?Bilateral Distance:   ? ?Right Eye Near:    ?  Left Eye Near:    ?Bilateral Near:    ? ?Physical Exam ?Constitutional:   ?   Appearance: Normal appearance.  ?HENT:  ?   Head: Normocephalic.  ?Cardiovascular:  ?   Rate and Rhythm: Normal rate and regular rhythm.  ?Pulmonary:  ?   Effort: Pulmonary effort is normal.  ?   Breath sounds: Normal breath sounds.  ?Musculoskeletal:     ?   General: Normal range of motion.  ?   Cervical back: Normal range of motion.  ?Skin: ?   General: Skin is warm and dry.  ?   Capillary Refill: Capillary refill takes less than 2 seconds.  ?Neurological:  ?   General: No focal deficit present.  ?   Mental Status: She is alert and oriented to person, place, and time.  ?Psychiatric:     ?   Mood and Affect: Mood normal.  ? ? ?Vaginal cytology self collected ?UC Treatments / Results  ?Labs ?(all labs ordered are listed, but only abnormal results are displayed) ?Labs Reviewed  ?CERVICOVAGINAL ANCILLARY ONLY  ? ? ?EKG ? ? ?Radiology ?No results found. ? ?Procedures ?Procedures (including critical care time) ? ?Medications Ordered in UC ?Medications - No data to display ? ?Initial Impression / Assessment and Plan / UC Course  ?I have reviewed the triage vital signs and the nursing notes. ? ?Pertinent labs & imaging results that were available during my care of the patient were reviewed by me and considered in my medical decision making (see chart for details). ? ?  ?Acute vaginitis  ?Treatment today with Diflucan and nystatin for topical treatment of acute irritation.  Vaginal cytology pending. ?Our office will notify you if any additional treatment is warranted following the results of your cytology.  If any your symptoms worsen or do not really improve follow-up with your primary care provider or return here for evaluation. ?Final Clinical Impressions(s) / UC Diagnoses  ? ?Final diagnoses:  ?Acute vaginitis  ? ? ? ?Discharge Instructions   ? ?  ?Vaginal cytology will result within 1 to 2 days.  If any additional treatment is warranted  following receipt of your lab results we will notify you by phone and discuss treatment. ?I have prescribed you Diflucan take 1 tablet today repeat in 3 days if needed.  I have also prescribed a topical antifungal antiirr

## 2021-05-08 NOTE — Discharge Instructions (Signed)
Vaginal cytology will result within 1 to 2 days.  If any additional treatment is warranted following receipt of your lab results we will notify you by phone and discuss treatment. ?I have prescribed you Diflucan take 1 tablet today repeat in 3 days if needed.  I have also prescribed a topical antifungal antiirritation cream that she can apply directly to your area of vaginal irritation. ?

## 2021-05-08 NOTE — ED Triage Notes (Signed)
Pt here with vaginal irritation x 2 days. Unsure if it is due to a new skin lightening product she's using.  ?

## 2021-05-10 LAB — CERVICOVAGINAL ANCILLARY ONLY
Bacterial Vaginitis (gardnerella): NEGATIVE
Candida Glabrata: NEGATIVE
Candida Vaginitis: POSITIVE — AB
Chlamydia: NEGATIVE
Comment: NEGATIVE
Comment: NEGATIVE
Comment: NEGATIVE
Comment: NEGATIVE
Comment: NEGATIVE
Comment: NORMAL
Neisseria Gonorrhea: NEGATIVE
Trichomonas: NEGATIVE

## 2021-09-05 ENCOUNTER — Other Ambulatory Visit: Payer: Self-pay | Admitting: Obstetrics and Gynecology

## 2021-09-05 DIAGNOSIS — Z3169 Encounter for other general counseling and advice on procreation: Secondary | ICD-10-CM

## 2021-09-13 ENCOUNTER — Other Ambulatory Visit: Payer: Self-pay | Admitting: Obstetrics and Gynecology

## 2021-09-13 DIAGNOSIS — Z3169 Encounter for other general counseling and advice on procreation: Secondary | ICD-10-CM

## 2021-10-12 ENCOUNTER — Other Ambulatory Visit: Payer: Self-pay | Admitting: Obstetrics and Gynecology

## 2021-10-12 DIAGNOSIS — Z3169 Encounter for other general counseling and advice on procreation: Secondary | ICD-10-CM

## 2021-10-16 ENCOUNTER — Telehealth: Payer: Self-pay

## 2021-10-16 DIAGNOSIS — Z3169 Encounter for other general counseling and advice on procreation: Secondary | ICD-10-CM

## 2021-10-16 MED ORDER — FOLIC ACID 1 MG PO TABS: 1.0000 mg | ORAL_TABLET | Freq: Every day | ORAL | 3 refills | Status: DC

## 2021-10-16 NOTE — Telephone Encounter (Signed)
Pt called triage needing a refill on her Folic acid.

## 2021-11-28 ENCOUNTER — Ambulatory Visit: Payer: BC Managed Care – PPO | Admitting: Obstetrics and Gynecology

## 2021-12-15 HISTORY — PX: THYROIDECTOMY: SHX17

## 2022-01-09 ENCOUNTER — Ambulatory Visit: Payer: BC Managed Care – PPO | Admitting: Obstetrics and Gynecology

## 2022-01-10 ENCOUNTER — Other Ambulatory Visit (HOSPITAL_COMMUNITY)
Admission: RE | Admit: 2022-01-10 | Discharge: 2022-01-10 | Disposition: A | Discharge status: Home / Self Care | Payer: BC Managed Care – PPO | Source: Ambulatory Visit | Attending: Obstetrics and Gynecology | Admitting: Obstetrics and Gynecology

## 2022-01-10 ENCOUNTER — Ambulatory Visit (INDEPENDENT_AMBULATORY_CARE_PROVIDER_SITE_OTHER): Payer: BC Managed Care – PPO | Admitting: Obstetrics and Gynecology

## 2022-01-10 ENCOUNTER — Encounter: Payer: Self-pay | Admitting: Obstetrics and Gynecology

## 2022-01-10 VITALS — BP 120/82 | Ht 63.0 in | Wt 208.0 lb

## 2022-01-10 DIAGNOSIS — Z113 Encounter for screening for infections with a predominantly sexual mode of transmission: Secondary | ICD-10-CM | POA: Insufficient documentation

## 2022-01-10 DIAGNOSIS — Z1231 Encounter for screening mammogram for malignant neoplasm of breast: Secondary | ICD-10-CM

## 2022-01-10 DIAGNOSIS — Z3169 Encounter for other general counseling and advice on procreation: Secondary | ICD-10-CM

## 2022-01-10 DIAGNOSIS — Z01419 Encounter for gynecological examination (general) (routine) without abnormal findings: Secondary | ICD-10-CM

## 2022-01-10 MED ORDER — FOLIC ACID 1 MG PO TABS: 1.0000 mg | ORAL_TABLET | Freq: Every day | ORAL | 3 refills | Status: AC

## 2022-01-10 NOTE — Progress Notes (Signed)
PCP:  Erline Levine, MD   Chief Complaint  Patient presents with   Gynecologic Exam     HPI:      [name] is a 41 y.o. G3P0030 whose LMP was Patient's last menstrual period was 12/24/2021 (exact date)., presents today for her annual examination.  Her menses are monthly, lasting 7 days, mod flow to heavy flow, no BTB. No dysmen. Has IDA, followed by [facility]. Last labs 12/23. Did depo in past for cycles but stopped 9/19 to conceive. Hasn't conceived yet but no longer interested in actively trying to conceive. Conception ok if it happens. Taking 1 g folic acid.  Had positive serum prog 5/21. Partner without recent children (youngest is 55), no semen analysis done. Had GYN u/s 7/22 that was normal.  Was supposed to have hysteroscopy and HSG with Dr. Gilman Schmidt a couple yrs ago but was never scheduled. Discussed partner doing semen analysis last yr but not done. Hx of first trimester loss in 2007 and then a second trimester loss at 19 weeks when she presented with painless dilation and chorioamnionitis. Hx of TAB at age 69.  She has a history of endometriosis and was treated in the past with Lupron, Depo-Provera, and extended cycle OCP. Diagnosed with gallstones and liver lesions and can no longer have estrogen BC. Had 2 Mirenas in the past but they both "fell out".   She has a hx of thyromegaly with nodules. She has had neg labs, bx and repeat u/s. Followed by endocrine at [facility], scheduled for partial thyroidectomy later this wk.   Hx of hepatic adenomas; treated with bland embolization 9/21 at [facility].   Sex activity: single partner, contraception - none. Conception ok, taking Rx folic acid.  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 Would like full STD testing today.  Mammogram: 03/30/19 Results were normal, repeat in 12 months.  There is a FH of breast cancer in her pat grt aunt, genetic testing not indicated. There  is no FH of ovarian cancer. The patient does not do self-breast exams.  Tobacco use: The patient denies current or previous tobacco use. Alcohol use: social No drug use.  Exercise: occas active  She does get adequate calcium and Vitamin D in her diet.  Labs with PCP. Pt on ozempic for wt loss/DM. Doing great with wt loss.    Past Medical History:  Diagnosis Date   Cellulitis 11/2016   lower abdomen   Cervical dysplasia    CIN II   Diabetes mellitus without complication (HCC)    Dysmenorrhea    Endometriosis    GERD (gastroesophageal reflux disease)    Hepatic adenoma    Hiatal hernia    History of abnormal cervical Pap smear    History of loop electrical excision procedure (LEEP) 03/2005   CIN 2   Proteinuria    Thyroid nodule    Thyromegaly     Past Surgical History:  Procedure Laterality Date   BREAST BIOPSY     BREAST SURGERY  06/2014   scar revision of left side   COLONOSCOPY  02/24/2016   COLPOSCOPY     EMBOLIZATION  09/2019   hepatic adenomas, at [facility]    ESOPHAGOGASTRODUODENOSCOPY ENDOSCOPY     LAPAROSCOPY     LEEP     REDUCTION MAMMAPLASTY     reduction mammoplasty Bilateral    RHINOPLASTY     deviated septum   sweat  glands removed under left arm      Family History  Problem Relation Age of Onset   Hypertension Mother    Prostate cancer Paternal Uncle    Brain cancer Paternal Grandfather 63   Breast cancer Other 67   Colon cancer Other     Social History   Socioeconomic History   Marital status: Single    Spouse name: Not on file   Number of children: Not on file   Years of education: Not on file   Highest education level: Not on file  Occupational History   Not on file  Tobacco Use   Smoking status: Never   Smokeless tobacco: Never  Vaping Use   Vaping Use: Never used  Substance and Sexual Activity   Alcohol use: No   Drug use: No   Sexual activity: Yes    Birth control/protection: Pill  Other Topics Concern   Not on file   Social History Narrative   Not on file   Social Determinants of Health   Financial Resource Strain: Not on file  Food Insecurity: Not on file  Transportation Needs: Not on file  Physical Activity: Inactive (12/18/2017)   Exercise Vital Sign    Days of Exercise per Week: 0 days    Minutes of Exercise per Session: 0 min  Stress: No Stress Concern Present (12/18/2017)   [place]    Feeling of Stress : Only a little  Social Connections: Somewhat Isolated (12/18/2017)   Social Connection and Isolation Panel [NHANES]    Frequency of Communication with Friends and Family: More than three times a week    Frequency of Social Gatherings with Friends and Family: Once a week    Attends Religious Services: More than 4 times per year    Active Member of [employer] or Organizations: No    Attends Archivist Meetings: Never    Marital Status: Never married  Intimate Partner Violence: Not At Risk (12/18/2017)   Humiliation, Afraid, Rape, and Kick questionnaire    Fear of Current or Ex-Partner: No    Emotionally Abused: No    Physically Abused: No    Sexually Abused: No    Current Meds  Medication Sig   cetirizine (ZYRTEC) 10 MG tablet TAKE 1 TABLET BY MOUTH EVERY DAY (NOT COVER)   Docusate Sodium (DSS) 100 MG CAPS Take by mouth.   folic acid (FOLVITE) 1 MG tablet Take 1 tablet (1 mg total) by mouth daily.   ibuprofen (ADVIL) 800 MG tablet Take 1 tablet (800 mg total) by mouth every 8 (eight) hours as needed.   ONE TOUCH ULTRA TEST test strip TEST BLOOD SUGARS TID. PLEASE DISPENSE PER INSURANCE COVERAGE. ICD-10 Q22.2   ONETOUCH DELICA LANCETS 97L MISC 1 EACH BY OTHER ROUTE THREE (3) TIMES A DAY.   OZEMPIC, 0.25 OR 0.5 MG/DOSE, 2 MG/1.5ML SOPN Inject into the skin.   venlafaxine XR (EFFEXOR-XR) 150 MG 24 hr capsule Take 150 mg by mouth daily.     ROS:  Review of Systems  Constitutional:  Positive for fatigue. Negative  for fever and unexpected weight change.  Respiratory:  Negative for cough, shortness of breath and wheezing.   Cardiovascular:  Negative for chest pain, palpitations and leg swelling.  Gastrointestinal:  Negative for blood in stool, constipation, diarrhea, nausea and vomiting.  Endocrine: Negative for cold intolerance, heat intolerance and polyuria.  Genitourinary:  Negative for dyspareunia, dysuria, flank pain, frequency, genital sores, hematuria,  menstrual problem, pelvic pain, urgency, vaginal bleeding, vaginal discharge and vaginal pain.  Musculoskeletal:  Negative for back pain, joint swelling and myalgias.  Skin:  Negative for rash.  Neurological:  Positive for headaches. Negative for dizziness, syncope, light-headedness and numbness.  Hematological:  Negative for adenopathy.  Psychiatric/Behavioral:  Negative for agitation, confusion, sleep disturbance and suicidal ideas. The patient is not nervous/anxious.      Objective: BP 120/82   Ht '5\' 3"'$  (1.6 m)   Wt 208 lb (94.3 kg)   LMP 12/24/2021 (Exact Date)   BMI 36.85 kg/m    Physical Exam Constitutional:      Appearance: She is well-developed.  Genitourinary:     Vulva normal.     Right Labia: No rash, tenderness or lesions.    Left Labia: No tenderness, lesions or rash.    No vaginal discharge, erythema or tenderness.      Right Adnexa: not tender and no mass present.    Left Adnexa: not tender and no mass present.    No cervical friability or polyp.     Uterus is not enlarged or tender.  Breasts:    Right: No mass, nipple discharge, skin change or tenderness.     Left: No mass, nipple discharge, skin change or tenderness.  HENT:     Head:      Comments: LARGE LT GOITER Neck:     Thyroid: No thyromegaly.  Cardiovascular:     Rate and Rhythm: Normal rate and regular rhythm.     Heart sounds: Normal heart sounds. No murmur heard. Pulmonary:     Effort: Pulmonary effort is normal.     Breath sounds: Normal breath  sounds.  Abdominal:     Palpations: Abdomen is soft.     Tenderness: There is no abdominal tenderness. There is no guarding or rebound.  Musculoskeletal:        General: Normal range of motion.     Cervical back: Normal range of motion.  Lymphadenopathy:     Cervical: No cervical adenopathy.  Neurological:     General: No focal deficit present.     Mental Status: She is alert and oriented to person, place, and time.     Cranial Nerves: No cranial nerve deficit.  Skin:    General: Skin is warm and dry.  Psychiatric:        Mood and Affect: Mood normal.        Behavior: Behavior normal.        Thought Content: Thought content normal.        Judgment: Judgment normal.  Vitals reviewed.     Assessment/Plan: Encounter for annual routine gynecological examination  Screening for STD (sexually transmitted disease) - Plan: HIV Antibody (routine testing w rflx), RPR, Hepatitis C antibody, Cervicovaginal ancillary only  Encounter for screening mammogram for malignant neoplasm of breast - Plan: MM 3D SCREEN BREAST BILATERAL; pt to schedule mammo  Meds ordered this encounter  Medications   folic acid (FOLVITE) 1 MG tablet    Sig: Take 1 tablet (1 mg total) by mouth daily.    Dispense:  90 tablet    Refill:  3    Order Specific Question:   Supervising Provider    Answer:   Renaldo Reel      GYN counsel adequate intake of calcium and vitamin D, diet and exercise     F/U  Return in about 1 year (around 01/11/2023).  Halston Kintz B. Bettymae Yott, PA-C 01/10/2022 4:17 PM

## 2022-01-10 NOTE — Patient Instructions (Signed)
I value your feedback and you entrusting us with your care. If you get a [facility] patient survey, I would appreciate you taking the time to let us know about your experience today. Thank you!  [facility]: [phone]      

## 2022-01-11 LAB — RPR: RPR Ser Ql: NONREACTIVE

## 2022-01-11 LAB — HIV ANTIBODY (ROUTINE TESTING W REFLEX): HIV Screen 4th Generation wRfx: NONREACTIVE

## 2022-01-11 LAB — HEPATITIS C ANTIBODY: Hep C Virus Ab: NONREACTIVE

## 2022-01-12 LAB — CERVICOVAGINAL ANCILLARY ONLY
Chlamydia: NEGATIVE
Comment: NEGATIVE
Comment: NEGATIVE
Comment: NORMAL
Neisseria Gonorrhea: NEGATIVE
Trichomonas: NEGATIVE

## 2022-02-02 ENCOUNTER — Telehealth: Payer: Self-pay

## 2022-02-02 NOTE — Telephone Encounter (Signed)
Pt called triage and is requesting referral for mammo to be sent to [facility]. She gets a 75% discount through them since she works for [employer].

## 2022-02-04 NOTE — Telephone Encounter (Signed)
She can call [facility]) directly to schedule. [place]: [phone]. If she wants done at [facility], will fax order

## 2022-02-05 NOTE — Telephone Encounter (Signed)
Pt aware, number given.

## 2022-02-19 ENCOUNTER — Encounter: Payer: Self-pay | Admitting: Obstetrics and Gynecology

## 2022-02-19 MED ORDER — NAPROXEN SODIUM 550 MG PO TABS: 550.0000 mg | ORAL_TABLET | Freq: Two times a day (BID) | ORAL | 1 refills | Status: AC | PRN

## 2022-12-06 ENCOUNTER — Encounter: Payer: Self-pay | Admitting: *Deleted

## 2022-12-06 ENCOUNTER — Ambulatory Visit
Admission: EM | Admit: 2022-12-06 | Discharge: 2022-12-06 | Disposition: A | Discharge status: Home / Self Care | Payer: BC Managed Care – PPO

## 2022-12-06 DIAGNOSIS — J0100 Acute maxillary sinusitis, unspecified: Secondary | ICD-10-CM

## 2022-12-06 DIAGNOSIS — J209 Acute bronchitis, unspecified: Secondary | ICD-10-CM

## 2022-12-06 MED ORDER — AZITHROMYCIN 250 MG PO TABS
250.0000 mg | ORAL_TABLET | Freq: Every day | ORAL | 0 refills | Status: AC
Start: 2022-12-06 — End: ?

## 2022-12-06 MED ORDER — PREDNISONE 10 MG PO TABS: 40.0000 mg | ORAL_TABLET | Freq: Every day | ORAL | 0 refills | Status: AC

## 2022-12-06 NOTE — ED Triage Notes (Signed)
Patient states cough for about 2 weeks, saw PCP initially and negative respiratory panel at that time.  Now coughing up chunks of mucus

## 2022-12-06 NOTE — Discharge Instructions (Signed)
Take the prednisone and Zithromax as directed.  Follow up with your primary care provider if your symptoms are not improving.

## 2022-12-06 NOTE — ED Provider Notes (Signed)
Melissa Schroeder    CSN: [id number] Arrival date & time: 12/06/22  1747      History   Chief Complaint Chief Complaint  Patient presents with   Cough    HPI Melissa Schroeder is a 42 y.o. female.  Patient presents with 2 to 3-week history of congestion and cough.  Her cough has gotten worse and is productive.  She denies fever, chest pain, shortness of breath, or other symptoms.  Treatment attempted with OTC cough medicine.  Patient was seen at [facility] same-day clinic on 11/23/2022; diagnosed with cough; treated symptomatically.  Her medical history includes hypertension and diabetes.  The history is provided by the patient and medical records.    Past Medical History:  Diagnosis Date   Cellulitis 11/2016   lower abdomen   Cervical dysplasia    CIN II   Diabetes mellitus without complication (HCC)    Dysmenorrhea    Endometriosis    GERD (gastroesophageal reflux disease)    Hepatic adenoma    Hiatal hernia    History of abnormal cervical Pap smear    History of loop electrical excision procedure (LEEP) 03/2005   CIN 2   Proteinuria    Thyroid nodule    Thyromegaly     Patient Active Problem List   Diagnosis Date Noted   History of cervical dysplasia 06/14/2020   Diabetes mellitus without complication (HCC)    Hepatic adenoma    Thyromegaly    Iron deficiency anemia due to chronic blood loss 01/23/2019   Urge incontinence 12/18/2017   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   Cellulitis and abscess of trunk 12/18/2016   Choledocholithiasis 10/28/2016   Obesity (BMI 30-39.9) 10/28/2016   Essential hypertension 05/30/2016   Endometriosis 02/23/2014   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   Diabetic nephropathy (HCC) 12/28/2011   Hiatal hernia 06/29/2011   Polycystic ovarian disease 06/29/2011   Gastro-esophageal reflux disease with esophagitis 04/27/2011   History of loop electrical excision procedure (LEEP) 03/2005    Past Surgical History:  Procedure Laterality  Date   BREAST BIOPSY     BREAST SURGERY  06/2014   scar revision of left side   COLONOSCOPY  02/24/2016   COLPOSCOPY     EMBOLIZATION  09/2019   hepatic adenomas, at [facility]    ESOPHAGOGASTRODUODENOSCOPY ENDOSCOPY     LAPAROSCOPY     LEEP     REDUCTION MAMMAPLASTY     reduction mammoplasty Bilateral    RHINOPLASTY     deviated septum   sweat glands removed under left arm     THYROIDECTOMY  12/2021    OB History     Gravida  3   Para      Term      Preterm      AB  3   Living         SAB  3   IAB  0   Ectopic      Multiple      Live Births               Home Medications    Prior to Admission medications   Medication Sig Start Date End Date Taking? Authorizing Provider  azithromycin (ZITHROMAX) 250 MG tablet Take 1 tablet (250 mg total) by mouth daily. Take first 2 tablets together, then 1 every day until finished. 12/06/22  Yes Mickie Bail, NP  levothyroxine (SYNTHROID) 50 MCG tablet Take 50 mcg by mouth daily before breakfast.   Yes [provider]  predniSONE (DELTASONE) 10 MG tablet Take 4 tablets (40 mg total) by mouth daily for 5 days. 12/06/22 12/11/22 Yes Mickie Bail, NP  albuterol (VENTOLIN HFA) 108 (90 Base) MCG/ACT inhaler Inhale into the lungs. 07/10/19 07/09/20  [provider]  cetirizine (ZYRTEC) 10 MG tablet TAKE 1 TABLET BY MOUTH EVERY DAY (NOT COVER) 02/14/13   [provider]  Docusate Sodium (DSS) 100 MG CAPS Take by mouth.    [provider]  ferrous sulfate 325 (65 FE) MG tablet Take 1 tablet by mouth daily. 01/04/20 01/03/21  [provider]  folic acid (FOLVITE) 1 MG tablet Take 1 tablet (1 mg total) by mouth daily. 01/10/22   Copland, Helmut Muster B, PA-C  glipiZIDE (GLUCOTROL XL) 10 MG 24 hr tablet Take 10 mg by mouth. Patient not taking: Reported on 01/10/2022 11/16/16   [provider]  naproxen sodium (ANAPROX) 550 MG tablet Take 1 tablet (550 mg total) by mouth 2 (two) times daily  as needed. 02/19/22   Copland, Ilona Sorrel, PA-C  norethindrone (MICRONOR) 0.35 MG tablet Take 1 tablet (0.35 mg total) by mouth daily. Patient not taking: Reported on 01/10/2022 07/02/20   Copland, Alicia B, PA-C  nystatin cream (MYCOSTATIN) Apply 1 application. topically 2 (two) times daily. Patient not taking: Reported on 01/10/2022 05/08/21   Bing Neighbors, NP  ONE TOUCH ULTRA TEST test strip TEST BLOOD SUGARS TID. PLEASE DISPENSE PER INSURANCE COVERAGE. ICD-10 E11.9 10/01/16   [provider]  ONETOUCH DELICA LANCETS 33G MISC 1 EACH BY OTHER ROUTE THREE (3) TIMES A DAY. 11/19/16   [provider]  OZEMPIC, 0.25 OR 0.5 MG/DOSE, 2 MG/1.5ML SOPN Inject into the skin. 05/30/20   [provider]  traZODone (DESYREL) 50 MG tablet Take 50 mg by mouth at bedtime. Patient not taking: Reported on 01/10/2022 06/01/20   [provider]  venlafaxine XR (EFFEXOR-XR) 150 MG 24 hr capsule Take 150 mg by mouth daily. 03/19/20   [provider]    Family History Family History  Problem Relation Age of Onset   Hypertension Mother    Prostate cancer Paternal Uncle    Brain cancer Paternal Grandfather 1   Breast cancer Other 55   Colon cancer Other     Social History Social History   Tobacco Use   Smoking status: Never   Smokeless tobacco: Never  Vaping Use   Vaping status: Never Used  Substance Use Topics   Alcohol use: Yes    Comment: socially   Drug use: No     Allergies   Sulfa antibiotics and Sulfamethoxazole-trimethoprim   Review of Systems Review of Systems  Constitutional:  Negative for chills and fever.  HENT:  Positive for congestion and postnasal drip. Negative for ear pain and sore throat.   Respiratory:  Positive for cough. Negative for shortness of breath.   Cardiovascular:  Negative for chest pain and palpitations.     Physical Exam Triage Vital Signs ED Triage Vitals  Encounter Vitals Group     BP 12/06/22 1905 127/77      Systolic BP Percentile --      Diastolic BP Percentile --      Pulse Rate 12/06/22 1905 70     Resp 12/06/22 1905 18     Temp 12/06/22 1905 99.1 F (37.3 C)     Temp Source 12/06/22 1905 Oral     SpO2 12/06/22 1905 97 %     Weight 12/06/22 1901 220 lb (  99.8 kg)     Height 12/06/22 1901 5\' 3"  (1.6 m)     Head Circumference --      Peak Flow --      Pain Score 12/06/22 1901 0     Pain Loc --      Pain Education --      Exclude from Growth Chart --    No data found.  Updated Vital Signs BP 127/77 (BP Location: Left Arm)   Pulse 70   Temp 99.1 F (37.3 C) (Oral)   Resp 18   Ht 5\' 3"  (1.6 m)   Wt 220 lb (99.8 kg)   LMP 12/01/2022 (Exact Date)   SpO2 97%   BMI 38.97 kg/m   Visual Acuity Right Eye Distance:   Left Eye Distance:   Bilateral Distance:    Right Eye Near:   Left Eye Near:    Bilateral Near:     Physical Exam Constitutional:      General: She is not in acute distress. HENT:     Right Ear: Tympanic membrane normal.     Left Ear: Tympanic membrane normal.     Nose: Congestion present.     Mouth/Throat:     Mouth: Mucous membranes are moist.     Pharynx: Oropharynx is clear.  Cardiovascular:     Rate and Rhythm: Normal rate and regular rhythm.     Heart sounds: Normal heart sounds.  Pulmonary:     Effort: Pulmonary effort is normal. No respiratory distress.     Breath sounds: Normal breath sounds.  Skin:    General: Skin is warm and dry.  Neurological:     Mental Status: She is alert.      UC Treatments / Results  Labs (all labs ordered are listed, but only abnormal results are displayed) Labs Reviewed - No data to display  EKG   Radiology No results found.  Procedures Procedures (including critical care time)  Medications Ordered in UC Medications - No data to display  Initial Impression / Assessment and Plan / UC Course  I have reviewed the triage vital signs and the nursing notes.  Pertinent labs & imaging results that were  available during my care of the patient were reviewed by me and considered in my medical decision making (see chart for details).    Acute sinusitis and bronchitis.  Patient has been symptomatic for 2 to 3 weeks and is getting worse despite OTC treatment.  Treating today with prednisone and Zithromax.  Instructed her to use her albuterol inhaler as needed.  Education provided on bronchitis and sinus infection.  Instructed patient to follow-up with her PCP if she is not improving.  She agrees to plan of care.  Final Clinical Impressions(s) / UC Diagnoses   Final diagnoses:  Acute non-recurrent maxillary sinusitis  Acute bronchitis, unspecified organism     Discharge Instructions      Take the prednisone and Zithromax as directed.  Follow-up with your primary care provider if your symptoms are not improving.      ED Prescriptions     Medication Sig Dispense Auth. Provider   predniSONE (DELTASONE) 10 MG tablet Take 4 tablets (40 mg total) by mouth daily for 5 days. 20 tablet Mickie Bail, NP   azithromycin (ZITHROMAX) 250 MG tablet Take 1 tablet (250 mg total) by mouth daily. Take first 2 tablets together, then 1 every day until finished. 6 tablet Mickie Bail, NP      PDMP  not reviewed this encounter.   Mickie Bail, NP 12/06/22 [phone]

## 2023-01-21 ENCOUNTER — Ambulatory Visit: Payer: BC Managed Care – PPO | Admitting: Obstetrics and Gynecology

## 2023-02-12 ENCOUNTER — Ambulatory Visit: Payer: BC Managed Care – PPO | Admitting: Obstetrics and Gynecology

## 2023-02-26 ENCOUNTER — Ambulatory Visit: Payer: BC Managed Care – PPO | Admitting: Obstetrics and Gynecology

## 2023-12-13 ENCOUNTER — Other Ambulatory Visit: Payer: Self-pay | Admitting: Obstetrics and Gynecology

## 2023-12-16 ENCOUNTER — Encounter: Payer: Self-pay | Admitting: Emergency Medicine

## 2023-12-16 ENCOUNTER — Ambulatory Visit
Admission: EM | Admit: 2023-12-16 | Discharge: 2023-12-16 | Disposition: A | Discharge status: Home / Self Care | Attending: Family Medicine | Admitting: Family Medicine

## 2023-12-16 DIAGNOSIS — H6991 Unspecified Eustachian tube disorder, right ear: Secondary | ICD-10-CM

## 2023-12-16 MED ORDER — PREDNISONE 20 MG PO TABS: 40.0000 mg | ORAL_TABLET | Freq: Every day | ORAL | 0 refills | Status: AC

## 2023-12-16 NOTE — Discharge Instructions (Addendum)
 Continue your Flonase and allergy medicine and start prednisone  daily for 5 days.  Follow-up with your ear nose and throat if your symptoms do not improve.  Go to the ER for any worsening symptoms.  Hope you feel better soon!

## 2023-12-16 NOTE — ED Provider Notes (Signed)
 [facility]    CSN: [id number] Arrival date & time: 12/16/23  1900      History   Chief Complaint Chief Complaint  Patient presents with   Otalgia    HPI Melissa Schroeder is a 43 y.o. female presents for ear pain.  Patient reports 1 week of right ear pain.  She denies any drainage, hearing changes, fevers chills or URI symptoms.  She has been taking Tylenol with minimal improvement.  No other concerns at this time.   Otalgia   Past Medical History:  Diagnosis Date   Cellulitis 11/2016   lower abdomen   Cervical dysplasia    CIN II   Diabetes mellitus without complication (HCC)    Dysmenorrhea    Endometriosis    GERD (gastroesophageal reflux disease)    Hepatic adenoma    Hiatal hernia    History of abnormal cervical Pap smear    History of loop electrical excision procedure (LEEP) 03/2005   CIN 2   Proteinuria    Thyroid  nodule    Thyromegaly     Patient Active Problem List   Diagnosis Date Noted   History of cervical dysplasia 06/14/2020   Diabetes mellitus without complication (HCC)    Hepatic adenoma    Thyromegaly    Iron deficiency anemia due to chronic blood loss 01/23/2019   Urge incontinence 12/18/2017   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   Cellulitis and abscess of trunk 12/18/2016   Choledocholithiasis 10/28/2016   Obesity (BMI 30-39.9) 10/28/2016   Essential hypertension 05/30/2016   Endometriosis 02/23/2014   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   Diabetic nephropathy (HCC) 12/28/2011   Hiatal hernia 06/29/2011   Polycystic ovarian disease 06/29/2011   Gastro-esophageal reflux disease with esophagitis 04/27/2011   History of loop electrical excision procedure (LEEP) 03/2005    Past Surgical History:  Procedure Laterality Date   BREAST BIOPSY     BREAST SURGERY  06/2014   scar revision of left side   COLONOSCOPY  02/24/2016   COLPOSCOPY     EMBOLIZATION  09/2019   hepatic adenomas, at [facility]    ESOPHAGOGASTRODUODENOSCOPY  ENDOSCOPY     LAPAROSCOPY     LEEP     REDUCTION MAMMAPLASTY     reduction mammoplasty Bilateral    RHINOPLASTY     deviated septum   sweat glands removed under left arm     THYROIDECTOMY  12/2021    OB History     Gravida  3   Para      Term      Preterm      AB  3   Living         SAB  3   IAB  0   Ectopic      Multiple      Live Births               Home Medications    Prior to Admission medications   Medication Sig Start Date End Date Taking? Authorizing Provider  ibuprofen  (ADVIL ) 800 MG tablet Take 1 tablet by mouth every 8 (eight) hours as needed. 12/06/22  Yes [provider]  predniSONE  (DELTASONE ) 20 MG tablet Take 2 tablets (40 mg total) by mouth daily with breakfast for 5 days. 12/16/23 12/21/23 Yes Ronne Stefanski, Jodi R, NP  propranolol (INDERAL) 20 MG tablet Take 20 mg by mouth. 10/09/23 10/03/24 Yes [provider]  albuterol (VENTOLIN HFA) 108 (90 Base) MCG/ACT inhaler Inhale into the lungs. 07/10/19 07/09/20  [provider]  azithromycin  (ZITHROMAX ) 250 MG tablet Take 1 tablet (250 mg total) by mouth daily. Take first 2 tablets together, then 1 every day until finished. 12/06/22   Corlis Burnard DEL, NP  cetirizine (ZYRTEC) 10 MG tablet TAKE 1 TABLET BY MOUTH EVERY DAY (NOT COVER) 02/14/13   [provider]  Docusate Sodium (DSS) 100 MG CAPS Take by mouth.    [provider]  ferrous sulfate 325 (65 FE) MG tablet Take 1 tablet by mouth daily. 01/04/20 01/03/21  [provider]  folic acid  (FOLVITE ) 1 MG tablet Take 1 tablet (1 mg total) by mouth daily. 01/10/22   Copland, Alicia B, PA-C  glipiZIDE (GLUCOTROL XL) 10 MG 24 hr tablet Take 10 mg by mouth. Patient not taking: Reported on 01/10/2022 11/16/16   [provider]  levothyroxine (SYNTHROID) 50 MCG tablet Take 50 mcg by mouth daily before breakfast.    [provider]  naproxen  sodium (ANAPROX ) 550 MG tablet Take 1 tablet (550 mg  total) by mouth 2 (two) times daily as needed. 02/19/22   Copland, Alicia B, PA-C  norethindrone  (MICRONOR ) 0.35 MG tablet Take 1 tablet (0.35 mg total) by mouth daily. Patient not taking: Reported on 01/10/2022 07/02/20   Copland, Alicia B, PA-C  nystatin  cream (MYCOSTATIN ) Apply 1 application. topically 2 (two) times daily. Patient not taking: Reported on 01/10/2022 05/08/21   Arloa Suzen RAMAN, NP  ONE TOUCH ULTRA TEST test strip TEST BLOOD SUGARS TID. PLEASE DISPENSE PER INSURANCE COVERAGE. ICD-10 E11.9 10/01/16   [provider]  ONETOUCH DELICA LANCETS 33G MISC 1 EACH BY OTHER ROUTE THREE (3) TIMES A DAY. 11/19/16   [provider]  OZEMPIC, 0.25 OR 0.5 MG/DOSE, 2 MG/1.5ML SOPN Inject into the skin. 05/30/20   [provider]  traZODone (DESYREL) 50 MG tablet Take 50 mg by mouth at bedtime. Patient not taking: Reported on 01/10/2022 06/01/20   [provider]  venlafaxine XR (EFFEXOR-XR) 150 MG 24 hr capsule Take 150 mg by mouth daily. 03/19/20   [provider]    Family History Family History  Problem Relation Age of Onset   Hypertension Mother    Prostate cancer Paternal Uncle    Brain cancer Paternal Grandfather 33   Breast cancer Other 32   Colon cancer Other     Social History Social History   Tobacco Use   Smoking status: Never   Smokeless tobacco: Never  Vaping Use   Vaping status: Never Used  Substance Use Topics   Alcohol use: Yes    Comment: socially   Drug use: No     Allergies   Sulfa antibiotics and Sulfamethoxazole-trimethoprim   Review of Systems Review of Systems  HENT:  Positive for ear pain.      Physical Exam Triage Vital Signs ED Triage Vitals  Encounter Vitals Group     BP 12/16/23 1946 (!) 139/90     Girls Systolic BP Percentile --      Girls Diastolic BP Percentile --      Boys Systolic BP Percentile --      Boys Diastolic BP Percentile --      Pulse Rate 12/16/23 1946 66     Resp 12/16/23 1946  16     Temp 12/16/23 1946 99.4 F (37.4 C)     Temp Source 12/16/23 1946 Oral     SpO2 12/16/23 1946 99 %     Weight 12/16/23 1944 197 lb (89.4 kg)  Height --      Head Circumference --      Peak Flow --      Pain Score 12/16/23 1944 6     Pain Loc --      Pain Education --      Exclude from Growth Chart --    No data found.  Updated Vital Signs BP (!) 139/90 (BP Location: Right Arm)   Pulse 66   Temp 99.4 F (37.4 C) (Oral)   Resp 16   Wt 197 lb (89.4 kg)   LMP 11/24/2023   SpO2 99%   BMI 34.90 kg/m   Visual Acuity Right Eye Distance:   Left Eye Distance:   Bilateral Distance:    Right Eye Near:   Left Eye Near:    Bilateral Near:     Physical Exam Vitals and nursing note reviewed.  Constitutional:      General: She is not in acute distress.    Appearance: Normal appearance. She is not ill-appearing.  HENT:     Head: Normocephalic and atraumatic.     Right Ear: Ear canal normal. A middle ear effusion is present.     Left Ear: Tympanic membrane and ear canal normal.  Eyes:     Pupils: Pupils are equal, round, and reactive to light.  Cardiovascular:     Rate and Rhythm: Normal rate.  Pulmonary:     Effort: Pulmonary effort is normal.  Skin:    General: Skin is warm and dry.  Neurological:     General: No focal deficit present.     Mental Status: She is alert and oriented to person, place, and time.  Psychiatric:        Mood and Affect: Mood normal.        Behavior: Behavior normal.      UC Treatments / Results  Labs (all labs ordered are listed, but only abnormal results are displayed) Labs Reviewed - No data to display  EKG   Radiology No results found.  Procedures Procedures (including critical care time)  Medications Ordered in UC Medications - No data to display  Initial Impression / Assessment and Plan / UC Course  I have reviewed the triage vital signs and the nursing notes.  Pertinent labs & imaging results that were  available during my care of the patient were reviewed by me and considered in my medical decision making (see chart for details).     Reviewed exam and symptoms with patient.  No red flags.  Discussed eustachian tube dysfunction.  She will continue Flonase and her allergy medicine and we will add on prednisone  daily.  She will follow-up with her ENT if symptoms do not improve.  ER precautions reviewed. Final Clinical Impressions(s) / UC Diagnoses   Final diagnoses:  Eustachian tube dysfunction, right     Discharge Instructions      Continue your Flonase and allergy medicine and start prednisone  daily for 5 days.  Follow-up with your ear nose and throat if your symptoms do not improve.  Go to the ER for any worsening symptoms.  Hope you feel better soon!    ED Prescriptions     Medication Sig Dispense Auth. Provider   predniSONE  (DELTASONE ) 20 MG tablet Take 2 tablets (40 mg total) by mouth daily with breakfast for 5 days. 10 tablet Trequan Marsolek, Jodi R, NP      PDMP not reviewed this encounter.   Loreda Myla SAUNDERS, NP 12/16/23 2005

## 2023-12-16 NOTE — ED Triage Notes (Signed)
 Pt presents with right ear pain x 1 week. Pt has taken OTC pain medication with no relief.
